# Patient Record
Sex: Male | Born: 1995 | Race: Black or African American | Hispanic: No | Marital: Single
Health system: Midwestern US, Community
[De-identification: ages and names within clinical notes are randomized; demographics above are authoritative.]

## PROBLEM LIST (undated history)

## (undated) DIAGNOSIS — E109 Type 1 diabetes mellitus without complications: Principal | ICD-10-CM

## (undated) DIAGNOSIS — G44219 Episodic tension-type headache, not intractable: Secondary | ICD-10-CM

## (undated) DIAGNOSIS — R519 Headache, unspecified: Principal | ICD-10-CM

## (undated) DIAGNOSIS — E782 Mixed hyperlipidemia: Secondary | ICD-10-CM

## (undated) DIAGNOSIS — K219 Gastro-esophageal reflux disease without esophagitis: Secondary | ICD-10-CM

## (undated) DIAGNOSIS — M6740 Ganglion, unspecified site: Secondary | ICD-10-CM

## (undated) HISTORY — PX: WRIST SURGERY: SHX841

---

## 2018-02-28 ENCOUNTER — Encounter (HOSPITAL_COMMUNITY): Payer: Self-pay | Admitting: Emergency Medicine

## 2018-02-28 ENCOUNTER — Other Ambulatory Visit: Payer: Self-pay

## 2018-02-28 ENCOUNTER — Ambulatory Visit (INDEPENDENT_AMBULATORY_CARE_PROVIDER_SITE_OTHER): Payer: Managed Care, Other (non HMO)

## 2018-02-28 ENCOUNTER — Ambulatory Visit (HOSPITAL_COMMUNITY)
Admission: EM | Admit: 2018-02-28 | Discharge: 2018-02-28 | Disposition: A | Discharge status: Home / Self Care | Payer: Managed Care, Other (non HMO) | Attending: Emergency Medicine | Admitting: Emergency Medicine

## 2018-02-28 DIAGNOSIS — M25511 Pain in right shoulder: Secondary | ICD-10-CM

## 2018-02-28 DIAGNOSIS — S0081XA Abrasion of other part of head, initial encounter: Secondary | ICD-10-CM | POA: Diagnosis not present

## 2018-02-28 HISTORY — DX: Ganglion, unspecified site: M67.40

## 2018-02-28 MED ORDER — IBUPROFEN 800 MG PO TABS
ORAL_TABLET | ORAL | Status: AC
  Filled 2018-02-28: qty 1

## 2018-02-28 MED ORDER — IBUPROFEN 800 MG PO TABS
800.0000 mg | ORAL_TABLET | Freq: Once | ORAL | Status: AC
  Administered 2018-02-28: 800 mg via ORAL

## 2018-02-28 NOTE — Discharge Instructions (Signed)
Ibuprofen regularly for the next 5-7 days. Take with food. Ice to areas affected including shoulder and chest. You will likely have more soreness tomorrow. Warm showers, hot compresses to sore muscles. Activity as tolerated, shoulder range of motion exercises as able. If symptoms worsen or do not improve in the next week to return to be seen or to follow up with your PCP.
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
## 2018-03-27 ENCOUNTER — Ambulatory Visit (HOSPITAL_COMMUNITY)
Admission: EM | Admit: 2018-03-27 | Discharge: 2018-03-27 | Disposition: A | Discharge status: Home / Self Care | Payer: Managed Care, Other (non HMO) | Attending: Physician Assistant | Admitting: Physician Assistant

## 2018-03-27 ENCOUNTER — Encounter (HOSPITAL_COMMUNITY): Payer: Self-pay | Admitting: Family Medicine

## 2018-03-27 DIAGNOSIS — H6123 Impacted cerumen, bilateral: Secondary | ICD-10-CM

## 2018-03-27 DIAGNOSIS — H9192 Unspecified hearing loss, left ear: Secondary | ICD-10-CM | POA: Diagnosis not present

## 2018-03-27 NOTE — ED Triage Notes (Signed)
Pt here for ear fullness and wax build up in the left ear

## 2018-03-27 NOTE — Discharge Instructions (Signed)
Ear wax removed today. You can use hydrogen peroxide/debrox intermittently to prevent build up. Recheck as needed.

## 2018-03-27 NOTE — ED Provider Notes (Signed)
[facility]    CSN: [id number] Arrival date & time: 03/27/18  1422     History   Chief Complaint Chief Complaint  Patient presents with  . Ear Fullness    HPI Maryellen PileJustin Sindelar is a 22 y.o. male.   22 year old male comes in for 1 day history of left ear fullness/decrease in hearing. Denies pain, drainage. Denies injury/trauma. Has had some rhinorrhea, otherwise denies other URI symptoms such as sore throat, cough, congestion. Denies fever, chills, night sweats. Tried debrox without improvement. Denies swimming/recent travels.      Past Medical History:  Diagnosis Date  . Ganglion cyst     There are no active problems to display for this patient.   Past Surgical History:  Procedure Laterality Date  . WRIST SURGERY         Home Medications    Prior to Admission medications   Not on File    Family History Family History  Problem Relation Age of Onset  . Healthy Mother     Social History Social History   Tobacco Use  . Smoking status: Never Smoker  Substance Use Topics  . Alcohol use: No    Frequency: Never  . Drug use: No     Allergies   Patient has no known allergies.   Review of Systems Review of Systems  Reason unable to perform ROS: See HPI as above.     Physical Exam Triage Vital Signs ED Triage Vitals [03/27/18 1541]  Enc Vitals Group     BP 110/62     Pulse Rate 70     Resp 18     Temp 98.2 F (36.8 C)     Temp src      SpO2 100 %     Weight      Height      Head Circumference      Peak Flow      Pain Score 0     Pain Loc      Pain Edu?      Excl. in GC?    No data found.  Updated Vital Signs BP 110/62   Pulse 70   Temp 98.2 F (36.8 C)   Resp 18   SpO2 100%   Physical Exam  Constitutional: He is oriented to person, place, and time. He appears well-developed and well-nourished. No distress.  HENT:  Head: Normocephalic and atraumatic.  Right Ear: External ear and ear canal normal.  Left Ear: External  ear and ear canal normal.  No tenderness to palpation of bilateral tragus. Bilateral cerumen impaction, TM not visible.   TM visible post irrigation.  No erythema, bulging.  TM pearly gray.  Eyes: Pupils are equal, round, and reactive to light. Conjunctivae are normal.  Neurological: He is alert and oriented to person, place, and time.     UC Treatments / Results  Labs (all labs ordered are listed, but only abnormal results are displayed) Labs Reviewed - No data to display  EKG None Radiology No results found.  Procedures Procedures (including critical care time)  Medications Ordered in UC Medications - No data to display   Initial Impression / Assessment and Plan / UC Course  I have reviewed the triage vital signs and the nursing notes.  Pertinent labs & imaging results that were available during my care of the patient were reviewed by me and considered in my medical decision making (see chart for details).    Patient with resolved  symptoms after cerumen removal.  Discussed ways to prevent future cerumen impaction. Recheck as needed.  Patient expresses understanding and agrees to plan.  Final Clinical Impressions(s) / UC Diagnoses   Final diagnoses:  Bilateral impacted cerumen    ED Discharge Orders    None         Lurline Idol 03/27/18 2007
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
## 2021-04-20 ENCOUNTER — Inpatient Hospital Stay
Admit: 2021-04-20 | Discharge: 2021-04-21 | Disposition: A | Discharge status: Home / Self Care | Payer: PRIVATE HEALTH INSURANCE | Attending: Emergency Medicine

## 2021-04-20 DIAGNOSIS — E1069 Type 1 diabetes mellitus with other specified complication: Principal | ICD-10-CM

## 2021-04-20 NOTE — ED Provider Notes (Signed)
[facility] ED  EMERGENCY DEPARTMENT ENCOUNTER      Pt Name: Brian Moses  MRN: [id number]  Birthdate [birth year]  Date of evaluation: 04/20/21      CHIEF COMPLAINT       Chief Complaint   Patient presents with   ??? Medication Refill     HISTORY OF PRESENT ILLNESS   HPI 25 y.o. male presents with c/o presents for refill of his diabetic medications.  The patient states that he is new to the area, he moved here from [place].  He has not been established with a PCP yet.  He ran out of his insulin today.  He needs refills.  He otherwise denies any complaints.Marland Kitchen     REVIEW OF SYSTEMS       Review of Systems   Constitutional: Negative for fever.   Respiratory: Negative for cough.    Gastrointestinal: Negative for abdominal pain, nausea and vomiting.   Skin: Negative for rash.       PAST MEDICAL HISTORY     Past Medical History:   Diagnosis Date   ??? Diabetes mellitus type 1 (HCC)        SURGICAL HISTORY     History reviewed. No pertinent surgical history.    CURRENT MEDICATIONS       Discharge Medication List as of 04/20/2021  9:52 PM      CONTINUE these medications which have NOT CHANGED    Details   insulin lispro (HUMALOG) 100 UNIT/ML injection vial Inject 6 Units into the skin 3 times daily (before meals)Historical Med             ALLERGIES     has No Known Allergies.    FAMILY HISTORY     has no family status information on file.        SOCIAL HISTORY      reports that he has never smoked. He has never used smokeless tobacco. He reports that he does not drink alcohol and does not use drugs.    PHYSICAL EXAM     INITIAL VITALS: BP 130/66    Pulse 64    Temp 98.6 ??F (37 ??C) (Oral)    Resp 16    Ht 6' (1.829 m)    Wt 155 lb (70.3 kg)    SpO2 100%    BMI 21.02 kg/m??   General: NAD  ENT: MMM  Cardiovascular: RRR  Pulmonary: CTA  Neuro: ANO x3 fluent speech ambulatory with a normal gait.    MEDICAL DECISION MAKING:     MDM  25 y.o. male presenting for refill of his diabetic medications.  He is a type I diabetic.  He has had  them up to today.  He is not showing no sign of DKA or dehydration from uncontrolled hyperglycemia.  I do not think we need to test any laboratory studies.  We will refill his medicine.  Provide him with outpatient follow-up. D/w pt treatment plan, warning precautions for prompt ED return and importance of close OP FU, he verbalizes understanding and agrees with the treatment plan.     DIAGNOSTIC RESULTS     EMERGENCY DEPARTMENT COURSE:   Vitals:    Vitals:    04/20/21 1907   BP: 130/66   Pulse: 64   Resp: 16   Temp: 98.6 ??F (37 ??C)   TempSrc: Oral   SpO2: 100%   Weight: 155 lb (70.3 kg)   Height: 6' (1.829 m)  The patient was given the following medications while in the emergency department:  Orders Placed This Encounter   Medications   ??? DISCONTD: insulin detemir (LEVEMIR) 100 UNIT/ML injection vial     Sig: Inject 13 Units into the skin nightly     Dispense:  2 each     Refill:  3   ??? DISCONTD: insulin aspart (NOVOLOG FLEXPEN) 100 UNIT/ML injection pen     Sig: Inject 4 Units into the skin 3 times daily (before meals)     Dispense:  5 pen     Refill:  0   ??? DISCONTD: Insulin Pen Needle 32G X 4 MM MISC     Sig: 1 each by Does not apply route daily     Dispense:  100 each     Refill:  0   ??? DISCONTD: Continuous Blood Gluc Sensor (FREESTYLE LIBRE 14 DAY SENSOR) MISC     Sig: Use as directed for glucose monitoring     Dispense:  2 each     Refill:  0   ??? Continuous Blood Gluc Sensor (FREESTYLE LIBRE 14 DAY SENSOR) MISC     Sig: Use as directed for glucose monitoring     Dispense:  2 each     Refill:  0   ??? insulin aspart (NOVOLOG FLEXPEN) 100 UNIT/ML injection pen     Sig: Inject 4 Units into the skin 3 times daily (before meals)     Dispense:  5 pen     Refill:  0   ??? insulin detemir (LEVEMIR) 100 UNIT/ML injection vial     Sig: Inject 13 Units into the skin nightly     Dispense:  2 each     Refill:  3   ??? Insulin Pen Needle 32G X 4 MM MISC     Sig: 1 each by Does not apply route daily     Dispense:  100 each      Refill:  0     -------------------------  CRITICAL CARE:   CONSULTS: None  PROCEDURES: Procedures     FINAL IMPRESSION      1. Type 1 diabetes mellitus with other specified complication [facility])          DISPOSITION/PLAN   DISPOSITION Decision To Discharge 04/20/2021 08:53:06 PM      PATIENT REFERRED TO:  [facility]  [address]  [place] [address]  [phone]        [facility]  [address]  [place] [address]  [phone]    As needed      DISCHARGE MEDICATIONS:  Discharge Medication List as of 04/20/2021  9:52 PM            Manley Mason, MD  Attending Emergency Physician                     Manley Mason, MD  04/20/21 [phone]

## 2021-04-21 MED ORDER — LEVEMIR 100 UNIT/ML SC SOLN
100 UNIT/ML | Freq: Every evening | SUBCUTANEOUS | 3 refills | Status: DC
Start: 2021-04-21 — End: 2021-04-20

## 2021-04-21 MED ORDER — NOVOLOG FLEXPEN 100 UNIT/ML SC SOPN
100 UNIT/ML | PEN_INJECTOR | Freq: Three times a day (TID) | SUBCUTANEOUS | 0 refills | Status: DC
Start: 2021-04-21 — End: 2021-04-20

## 2021-04-21 MED ORDER — FREESTYLE LIBRE 14 DAY SENSOR MISC
0 refills | Status: DC
Start: 2021-04-21 — End: 2021-05-29

## 2021-04-21 MED ORDER — NOVOLOG FLEXPEN 100 UNIT/ML SC SOPN
100 UNIT/ML | PEN_INJECTOR | Freq: Three times a day (TID) | SUBCUTANEOUS | 0 refills | Status: DC
Start: 2021-04-21 — End: 2021-05-29

## 2021-04-21 MED ORDER — LEVEMIR 100 UNIT/ML SC SOLN
100 UNIT/ML | Freq: Every evening | SUBCUTANEOUS | 3 refills | Status: DC
Start: 2021-04-21 — End: 2021-05-29

## 2021-04-21 MED ORDER — INSULIN PEN NEEDLE 32G X 4 MM MISC
Freq: Every day | 0 refills | Status: DC
Start: 2021-04-21 — End: 2021-10-14

## 2021-04-21 MED ORDER — INSULIN PEN NEEDLE 32G X 4 MM MISC
Freq: Every day | 0 refills | Status: DC
Start: 2021-04-21 — End: 2021-04-20

## 2021-04-21 MED ORDER — FREESTYLE LIBRE 14 DAY SENSOR MISC
0 refills | Status: DC
Start: 2021-04-21 — End: 2021-04-20

## 2021-05-29 ENCOUNTER — Ambulatory Visit
Admit: 2021-05-29 | Discharge: 2021-05-29 | Payer: PRIVATE HEALTH INSURANCE | Attending: Physician Assistant | Primary: Physician Assistant

## 2021-05-29 DIAGNOSIS — Z7689 Persons encountering health services in other specified circumstances: Secondary | ICD-10-CM

## 2021-05-29 LAB — POCT MICROALBUMIN
Creatinine Ur POCT: 300
Microalb, Ur: 30
Microalbumin Creatinine Ratio: <30

## 2021-05-29 LAB — POCT GLYCOSYLATED HEMOGLOBIN (HGB A1C): Hemoglobin A1C: 7 %

## 2021-05-29 MED ORDER — FREESTYLE LIBRE 14 DAY SENSOR MISC
2 refills | Status: DC
Start: 2021-05-29 — End: 2021-08-22

## 2021-05-29 MED ORDER — NOVOLOG FLEXPEN 100 UNIT/ML SC SOPN
100 UNIT/ML | PEN_INJECTOR | Freq: Three times a day (TID) | SUBCUTANEOUS | 0 refills | Status: DC
Start: 2021-05-29 — End: 2021-09-17

## 2021-05-29 NOTE — Progress Notes (Signed)
[facility]  [facility]  [address]  [address]  [place] [address]  Dept: [phone]  Dept Fax: [phone]    Brian Moses is a 25 y.o. male who presents today for his medical conditions/complaints as noted below.    Chief Complaint   Patient presents with   ??? Establish Care     Diabetes Type 1 needs referral to Endocrinology       HPI:     Patient presents to the office to establish care.  He is new to the area.  He has a past medical history including type 1 diabetes which was diagnosed a few months ago.      Today, primary concern is establishing care and getting refills for his diabetes.  Reports few months ago in [place] he was diagnosed with type 1 diabetes.  He went to the hospital on blood sugar was over 500.  He had made significant changes to his lifestyle habits and is currently on mealtime insulin 4 units.  He states that his morning sugars are typically between 90 and 100 and he is not using any insulin bolus at night.  Patient is tracking sugars with freestyle libre.  He denies any complications with his diabetes.  Denies any lightheadedness, dizziness, shortness of breath, chest pain, leg edema, syncope.  Denies any hypoglycemic episodes.  Denies any neurologic, cardiovascular issues.    No new or acute concerns.  BP stable.  Weight stable.      Hemoglobin A1C (%)   Date Value   05/29/2021 7.0             ( goal A1C is < 7)   No results found for: LABMICR  No results found for: LDLCHOLESTEROL, LDLCALC    (goal LDL is <100)   No results found for: AST, ALT, BUN, CR  BP Readings from Last 3 Encounters:   05/29/21 124/82   04/20/21 130/66          (goal 120/80)    Past Medical History:   Diagnosis Date   ??? Diabetes mellitus type 1 (HCC)       History reviewed. No pertinent surgical history.    History reviewed. No pertinent family history.    Social History     Tobacco Use   ??? Smoking status: Never Smoker   ??? Smokeless tobacco: Never Used   Substance Use  Topics   ??? Alcohol use: Never      Current Outpatient Medications   Medication Sig Dispense Refill   ??? Continuous Blood Gluc Sensor (FREESTYLE LIBRE 14 DAY SENSOR) MISC Use as directed for glucose monitoring 2 each 2   ??? insulin aspart (NOVOLOG FLEXPEN) 100 UNIT/ML injection pen Inject 4 Units into the skin 3 times daily (before meals) 5 pen 0   ??? Insulin Pen Needle 32G X 4 MM MISC 1 each by Does not apply route daily 100 each 0     No current facility-administered medications for this visit.     No Known Allergies    Health Maintenance   Topic Date Due   ??? Varicella vaccine (1 of 2 - 2-dose childhood series) Never done   ??? COVID-19 Vaccine (1) Never done   ??? Pneumococcal 0-64 years Vaccine (1 - PCV) Never done   ??? HPV vaccine (1 - Male 2-dose series) Never done   ??? HIV screen  Never done   ??? Hepatitis C screen  Never done   ??? DTaP/Tdap/Td  vaccine (1 - Tdap) Never done   ??? Flu vaccine (Season Ended) 08/28/2021   ??? Depression Screen  05/29/2022   ??? Hepatitis A vaccine  Aged Out   ??? Hepatitis B vaccine  Aged Out   ??? Hib vaccine  Aged Out   ??? Meningococcal (ACWY) vaccine  Aged Out       Subjective:      Review of Systems   Constitutional: Negative for chills, fatigue and fever.   HENT: Negative for congestion, rhinorrhea and sinus pain.    Eyes: Negative for pain.   Respiratory: Negative for cough and shortness of breath.    Cardiovascular: Negative for chest pain and leg swelling.   Gastrointestinal: Negative for abdominal pain, constipation, diarrhea, nausea and vomiting.   Genitourinary: Negative for difficulty urinating, enuresis and testicular pain.   Musculoskeletal: Negative for arthralgias, back pain and myalgias.   Skin: Negative for rash.   Neurological: Negative for dizziness and headaches.   Psychiatric/Behavioral: Negative for confusion and sleep disturbance. The patient is not nervous/anxious.    All other systems reviewed and are negative.      Objective:     Physical Exam  Vitals and nursing note  reviewed.   Constitutional:       General: He is not in acute distress.     Appearance: Normal appearance. He is normal weight.   HENT:      Head: Normocephalic.      Mouth/Throat:      Mouth: Mucous membranes are moist.   Eyes:      Extraocular Movements: Extraocular movements intact.      Conjunctiva/sclera: Conjunctivae normal.      Pupils: Pupils are equal, round, and reactive to light.   Cardiovascular:      Rate and Rhythm: Normal rate and regular rhythm.      Pulses: Normal pulses.      Heart sounds: Normal heart sounds.   Pulmonary:      Effort: Pulmonary effort is normal.      Breath sounds: Normal breath sounds.   Abdominal:      General: Abdomen is flat. Bowel sounds are normal.      Palpations: Abdomen is soft.      Tenderness: There is no abdominal tenderness.   Musculoskeletal:      Cervical back: Normal range of motion.      Right lower leg: No edema.      Left lower leg: No edema.   Lymphadenopathy:      Cervical: No cervical adenopathy.   Skin:     General: Skin is warm.      Capillary Refill: Capillary refill takes less than 2 seconds.   Neurological:      General: No focal deficit present.      Mental Status: He is alert and oriented to person, place, and time.   Psychiatric:         Mood and Affect: Mood normal.         Behavior: Behavior normal.       BP 124/82 (Site: Left Upper Arm, Position: Sitting, Cuff Size: Medium Adult)    Pulse 68    Resp 16    Ht 6\' 1"  (1.854 m)    Wt 161 lb 3.2 oz (73.1 kg)    SpO2 97%    BMI 21.27 kg/m??     Assessment:       ICD-10-CM    1. Encounter to establish care  Z76.89    2.  Type 1 diabetes mellitus without complication (HCC)  E10.9 POCT glycosylated hemoglobin (Hb A1C)     POCT microalbumin     HM DIABETES FOOT EXAM     Continuous Blood Gluc Sensor (FREESTYLE LIBRE 14 DAY SENSOR) MISC     insulin aspart (NOVOLOG FLEXPEN) 100 UNIT/ML injection pen     Willette Pa, MD, Endocrinology, [facility]            Plan:       1.  Initial visit to establish care.  2.   Patient with history of type 1 diabetes.  A1c is stable at 7.0.  He is to continue current insulin regimen and tracking with freestyle libre..  In addition, patient is given referral to endocrinology for further evaluation and assessment as I am leery on type I diagnosis.  Diabetic foot exam within normal limits.  Microalbumin check within normal limits.    Return in about 3 months (around 08/29/2021) for medication recheck, diabetes, blood pressure.    Orders Placed This Encounter   Procedures   ??? Willette Pa, MD, Endocrinology, [facility]     Referral Priority:   Routine     Referral Type:   Eval and Treat     Referral Reason:   Specialty Services Required     Referred to Provider:   Willette Pa, MD     Requested Specialty:   Endocrinology     Number of Visits Requested:   1   ??? POCT glycosylated hemoglobin (Hb A1C)   ??? POCT microalbumin   ??? HM DIABETES FOOT EXAM         Patient given educational materials - see patient instructions.  Discussed use, benefit, and side effects of prescribed medications.  All patient questions answered. Pt voiced understanding. Reviewed health maintenance.  Instructed to continue current medications, diet and exercise.  Patient agreed with treatment plan. Follow up as directed.        Electronically signed by Madelyn Brunner, PA-C on 05/29/2021 at 2:26 PM

## 2021-06-11 ENCOUNTER — Encounter: Attending: Family Medicine | Primary: Physician Assistant

## 2021-08-13 NOTE — Telephone Encounter (Signed)
Mailbox full, unable to lvm to ask Pt if he would like to see Kenard Gower in the [place] office on 08/29/21 for his appt or if he would like to rs appt for a different day in [place] office. 08/13/21.

## 2021-08-22 ENCOUNTER — Encounter

## 2021-08-22 NOTE — Telephone Encounter (Signed)
Last Visit Date: 05/29/2021   Next Visit Date: 08/29/2021

## 2021-08-25 MED ORDER — FREESTYLE LIBRE 14 DAY SENSOR MISC
2 refills | Status: DC
Start: 2021-08-25 — End: 2021-09-17

## 2021-08-29 ENCOUNTER — Ambulatory Visit
Admit: 2021-08-29 | Discharge: 2021-08-29 | Payer: PRIVATE HEALTH INSURANCE | Attending: Physician Assistant | Primary: Physician Assistant

## 2021-08-29 ENCOUNTER — Encounter: Attending: Physician Assistant | Primary: Physician Assistant

## 2021-08-29 DIAGNOSIS — E109 Type 1 diabetes mellitus without complications: Secondary | ICD-10-CM

## 2021-08-29 NOTE — Progress Notes (Signed)
[facility]  [facility] [place] [facility]  [address]  [place] [address]  Dept: [phone]  Dept Fax: [phone]    Brian Moses is a 25 y.o. male who presents today for his medical conditions/complaints as noted below.    Chief Complaint   Patient presents with    Diabetes     Patient is here to follow up with his diabetes, he denies chest pain and sob. Patient states that he has no new concerns. Had lab work on 08/06/21       HPI:     Patient presents to the office for diabetes and after endocrinology.  He was able to get into Dr. Jayme Cloud at [facility].  Blood work was reviewed and demonstrates increased A1c to 9.1.  Endocrinology adjusted his insulin regimen to include 6 units with meals and 10 units nightly.  He states the endocrinologist was most concerned for type I diabetes not type II.  I reviewed his lab results.  There is elevated cholesterol.  He states he is following with Dr. Jayme Cloud next week.    Patient denies any new or acute concerns today.  Denies any lightheadedness, dizziness, shortness of breath, chest pain, leg edema, syncope.  BP stable.  Weight slightly decreased from last office visit.    He reports that next month he will be moving to [place].  I strongly advise finding a PCP and endocrinologist now as it can take several weeks.        Hemoglobin A1C (%)   Date Value   05/29/2021 7.0             ( goal A1C is < 7)   No results found for: LABMICR  No results found for: LDLCHOLESTEROL, LDLCALC    (goal LDL is <100)   No results found for: AST, ALT, BUN, CR  BP Readings from Last 3 Encounters:   08/29/21 114/72   05/29/21 124/82   04/20/21 130/66          (goal 120/80)    Past Medical History:   Diagnosis Date    Diabetes mellitus type 1 (HCC)       History reviewed. No pertinent surgical history.    History reviewed. No pertinent family history.    Social History     Tobacco Use    Smoking status: Never    Smokeless tobacco: Never   Substance Use Topics     Alcohol use: Never      Current Outpatient Medications   Medication Sig Dispense Refill    Continuous Blood Gluc Sensor (FREESTYLE LIBRE 14 DAY SENSOR) MISC Use as directed for glucose monitoring 2 each 2    insulin aspart (NOVOLOG FLEXPEN) 100 UNIT/ML injection pen Inject 4 Units into the skin 3 times daily (before meals) (Patient taking differently: Inject 6 Units into the skin 3 times daily (before meals)) 5 pen 0    Insulin Pen Needle 32G X 4 MM MISC 1 each by Does not apply route daily 100 each 0    BASAGLAR KWIKPEN 100 UNIT/ML injection pen INJECT 10 UNITS SUBCUTANEOUS 1X PER DAY 90 DAYS      BD PEN NEEDLE MICRO U/F 32G X 6 MM MISC USE AS DIRECTED 4 TIMES A DAY FOR 90 DAYS       No current facility-administered medications for this visit.     No Known Allergies    Health Maintenance   Topic Date Due    COVID-19 Vaccine (1) Never done  Varicella vaccine (1 of 2 - 2-dose childhood series) Never done    Pneumococcal 0-64 years Vaccine (1 - PCV) Never done    HPV vaccine (1 - Male 2-dose series) Never done    HIV screen  Never done    Diabetic retinal exam  Never done    Hepatitis C screen  Never done    Hepatitis B vaccine (1 of 3 - Risk 3-dose series) Never done    DTaP/Tdap/Td vaccine (1 - Tdap) Never done    Flu vaccine (1) Never done    Diabetic foot exam  05/29/2022    A1C test (Diabetic or Prediabetic)  08/06/2022    Diabetic microalbuminuria test  08/06/2022    Lipids  08/06/2022    Depression Screen  08/29/2022    Hepatitis A vaccine  Aged Out    Hib vaccine  Aged Out    Meningococcal (ACWY) vaccine  Aged Out       Subjective:      Review of Systems   Constitutional:  Negative for chills, fatigue and fever.   HENT:  Negative for congestion, rhinorrhea and sinus pain.    Eyes:  Negative for pain.   Respiratory:  Negative for cough and shortness of breath.    Cardiovascular:  Negative for chest pain and leg swelling.   Gastrointestinal:  Negative for abdominal pain, constipation, diarrhea, nausea and  vomiting.   Genitourinary:  Negative for difficulty urinating, enuresis and testicular pain.   Musculoskeletal:  Negative for arthralgias, back pain and myalgias.   Skin:  Negative for rash.   Neurological:  Negative for dizziness and headaches.   Psychiatric/Behavioral:  Negative for confusion and sleep disturbance. The patient is not nervous/anxious.    All other systems reviewed and are negative.    Objective:     Physical Exam  Vitals and nursing note reviewed.   Constitutional:       General: He is not in acute distress.     Appearance: Normal appearance. He is normal weight.   HENT:      Head: Normocephalic.      Mouth/Throat:      Mouth: Mucous membranes are moist.   Eyes:      Extraocular Movements: Extraocular movements intact.      Conjunctiva/sclera: Conjunctivae normal.      Pupils: Pupils are equal, round, and reactive to light.   Cardiovascular:      Rate and Rhythm: Normal rate and regular rhythm.      Pulses: Normal pulses.      Heart sounds: Normal heart sounds. No murmur heard.  Pulmonary:      Effort: Pulmonary effort is normal. No respiratory distress.      Breath sounds: Normal breath sounds.   Musculoskeletal:      Cervical back: Normal range of motion.      Right lower leg: No edema.      Left lower leg: No edema.   Lymphadenopathy:      Cervical: No cervical adenopathy.   Skin:     General: Skin is warm.      Capillary Refill: Capillary refill takes less than 2 seconds.   Neurological:      General: No focal deficit present.      Mental Status: He is alert and oriented to person, place, and time.   Psychiatric:         Mood and Affect: Mood normal.         Behavior: Behavior normal.  BP 114/72    Pulse 72    Ht 6\' 1"  (1.854 m)    Wt 154 lb (69.9 kg)    SpO2 96%    BMI 20.32 kg/m??     Assessment:       ICD-10-CM    1. Type 1 diabetes mellitus without complication (HCC)  E10.9       2. Mixed hyperlipidemia  E78.2                Plan:      1.  Patient to continue current insulin regimen as  recommended by endocrinology.  I discussed the importance of tight glycemic control for the outcome of his disease.  I advised on counting carbohydrates.  Discussed the importance of CGM.  2.  Patient with evidence of mixed hyperlipidemia.  We discussed its role with diabetes.  I advised on possibly starting statin.  He would like to follow with endocrinology first.    Return if symptoms worsen or fail to improve.    No orders of the defined types were placed in this encounter.        Patient given educational materials - see patient instructions.  Discussed use, benefit, and side effects of prescribed medications.  All patient questions answered. Pt voiced understanding. Reviewed health maintenance.  Instructed to continue current medications, diet and exercise.  Patient agreed with treatment plan. Follow up as directed.        Electronically signed by , PA-C on 09/01/2021 at 8:58 AM

## 2021-09-05 LAB — HEMOGLOBIN A1C
AVERAGE GLUCOSE: 167
Hemoglobin A1C: 8.2 %

## 2021-09-17 ENCOUNTER — Encounter

## 2021-09-17 MED ORDER — FREESTYLE LIBRE 14 DAY SENSOR MISC
2 refills | Status: DC
Start: 2021-09-17 — End: 2021-11-03

## 2021-09-17 MED ORDER — NOVOLOG FLEXPEN 100 UNIT/ML SC SOPN
100 UNIT/ML | Freq: Three times a day (TID) | SUBCUTANEOUS | 0 refills | Status: DC
Start: 2021-09-17 — End: 2022-02-23

## 2021-09-17 NOTE — Telephone Encounter (Signed)
Last Visit Date: 08/29/2021   Next Visit Date: Visit date not found

## 2021-10-14 MED ORDER — INSULIN PEN NEEDLE 32G X 4 MM MISC
Freq: Every day | 2 refills | Status: AC
Start: 2021-10-14 — End: 2022-05-04

## 2021-10-14 NOTE — Telephone Encounter (Signed)
From: Maryellen Pile  To: Raymond Gurney  Sent: 10/10/2021 10:10 AM EDT  Subject: Running out of 84mm needles for [employer],    I???m starting to get low on the BD Nano 52mm x 320 needles for my Novolog pen and I can???t seem to request a refill. Can you please refill this for me? Thank you.    Maryellen Pile

## 2021-11-03 ENCOUNTER — Encounter

## 2021-11-03 MED ORDER — FREESTYLE LIBRE 14 DAY SENSOR MISC
2 refills | Status: DC
Start: 2021-11-03 — End: 2021-12-15

## 2021-11-03 NOTE — Telephone Encounter (Signed)
Last Visit Date: 08/29/2021   Next Visit Date: Visit date not found

## 2021-12-11 ENCOUNTER — Encounter: Payer: PRIVATE HEALTH INSURANCE | Attending: Physician Assistant | Primary: Physician Assistant

## 2021-12-12 ENCOUNTER — Encounter: Payer: PRIVATE HEALTH INSURANCE | Attending: Physician Assistant | Primary: Physician Assistant

## 2021-12-12 ENCOUNTER — Ambulatory Visit
Admit: 2021-12-12 | Discharge: 2021-12-12 | Payer: PRIVATE HEALTH INSURANCE | Attending: Physician Assistant | Primary: Physician Assistant

## 2021-12-12 DIAGNOSIS — E109 Type 1 diabetes mellitus without complications: Secondary | ICD-10-CM

## 2021-12-12 MED ORDER — ATORVASTATIN CALCIUM 10 MG PO TABS
10 MG | ORAL_TABLET | Freq: Every day | ORAL | 2 refills | Status: DC
Start: 2021-12-12 — End: 2022-01-05

## 2021-12-12 MED ORDER — BD PEN NEEDLE MICRO U/F 32G X 6 MM MISC
4 refills | Status: DC
Start: 2021-12-12 — End: 2022-05-04

## 2021-12-12 NOTE — Progress Notes (Signed)
[facility]  [facility] [place] [facility]  [address]  [place] [address]  Dept: [phone]  Dept Fax: [phone]    Brian Moses is a 25 y.o. male who presents today for his medical conditions/complaints as noted below.    Chief Complaint   Patient presents with    Diabetes     Has appointment on 01/05/2021 with [facility]       HPI:     Patient presents to the office for routine follow-up.  He has past medical history including type 1 diabetes.  Patient is currently following with [facility] for management of type 1 diabetes.  His last A1c was 6.6.  He currently is using 8 units Basaglar daily and 2 to 4 units NovoLog as needed with meals.  He is closely monitoring sugars with freestyle libre.  Patient is considering leaving endocrinology.  He has not been happy with care.  He would like for our office to manage his type 1 diabetes.  We discussed that typically type 1 diabetes is best managed with endocrinology, however if needed we can manage for him.    We reviewed most recent blood work.  There is evidence of dyslipidemia.  We discussed thoroughly about cholesterol and management.  He does admit to family history of high cholesterol.    Denies any new or acute concerns.  BP stable.  Weight stable.      Hemoglobin A1C (%)   Date Value   09/05/2021 8.2   05/29/2021 7.0             ( goal A1C is < 7)   No results found for: LABMICR  No results found for: LDLCHOLESTEROL, LDLCALC    (goal LDL is <100)   No results found for: AST, ALT, BUN, CR  BP Readings from Last 3 Encounters:   12/12/21 124/84   08/29/21 114/72   05/29/21 124/82          (goal 120/80)    Past Medical History:   Diagnosis Date    Diabetes mellitus type 1 (HCC)       History reviewed. No pertinent surgical history.    History reviewed. No pertinent family history.    Social History     Tobacco Use    Smoking status: Never    Smokeless tobacco: Never   Substance Use Topics    Alcohol use: Never      Current Outpatient Medications    Medication Sig Dispense Refill    BD PEN NEEDLE MICRO U/F 32G X 6 MM MISC USE AS DIRECTED 4 TIMES A DAY FOR 90 DAYS 100 each 4    atorvastatin (LIPITOR) 10 MG tablet Take 1 tablet by mouth daily 30 tablet 2    Insulin Pen Needle 32G X 4 MM MISC as directed      Insulin Pen Needle 32G X 4 MM MISC 1 each by Does not apply route daily 100 each 2    insulin aspart (NOVOLOG FLEXPEN) 100 UNIT/ML injection pen Inject 4 Units into the skin 3 times daily (before meals) 5 Adjustable Dose Pre-filled Pen Syringe 0    BASAGLAR KWIKPEN 100 UNIT/ML injection pen INJECT 10 UNITS SUBCUTANEOUS 1X PER DAY 90 DAYS      Continuous Blood Gluc Sensor (FREESTYLE LIBRE 14 DAY SENSOR) MISC Use as directed for glucose monitoring 2 each 2     No current facility-administered medications for this visit.     No Known Allergies    Health Maintenance  Topic Date Due    COVID-19 Vaccine (1) Never done    Varicella vaccine (1 of 2 - 2-dose childhood series) Never done    Pneumococcal 0-64 years Vaccine (1 - PCV) Never done    HPV vaccine (1 - Male 2-dose series) Never done    HIV screen  Never done    Diabetic retinal exam  Never done    Hepatitis C screen  Never done    Hepatitis B vaccine (1 of 3 - Risk 3-dose series) Never done    DTaP/Tdap/Td vaccine (1 - Tdap) Never done    Diabetic foot exam  05/29/2022    Diabetic microalbuminuria test  08/06/2022    Lipids  08/06/2022    A1C test (Diabetic or Prediabetic)  10/03/2022    Depression Screen  12/12/2022    Flu vaccine  Completed    Hepatitis A vaccine  Aged Out    Hib vaccine  Aged Out    Meningococcal (ACWY) vaccine  Aged Out       Subjective:      Review of Systems   Constitutional:  Negative for chills, fatigue and fever.   HENT:  Negative for congestion, rhinorrhea and sinus pain.    Eyes:  Negative for pain.   Respiratory:  Negative for cough and shortness of breath.    Cardiovascular:  Negative for chest pain and leg swelling.   Gastrointestinal:  Negative for abdominal pain,  constipation, diarrhea, nausea and vomiting.   Genitourinary:  Negative for difficulty urinating, enuresis and testicular pain.   Musculoskeletal:  Negative for arthralgias, back pain and myalgias.   Skin:  Negative for rash.   Neurological:  Negative for dizziness and headaches.   Psychiatric/Behavioral:  Negative for confusion and sleep disturbance. The patient is not nervous/anxious.      Objective:     Physical Exam  Vitals and nursing note reviewed.   Constitutional:       General: He is not in acute distress.     Appearance: Normal appearance. He is normal weight.   HENT:      Head: Normocephalic.      Mouth/Throat:      Mouth: Mucous membranes are moist.   Eyes:      Extraocular Movements: Extraocular movements intact.      Conjunctiva/sclera: Conjunctivae normal.      Pupils: Pupils are equal, round, and reactive to light.   Cardiovascular:      Rate and Rhythm: Normal rate and regular rhythm.      Pulses: Normal pulses.      Heart sounds: Normal heart sounds. No murmur heard.  Pulmonary:      Effort: Pulmonary effort is normal. No respiratory distress.      Breath sounds: Normal breath sounds.   Abdominal:      General: Abdomen is flat. Bowel sounds are normal.      Palpations: Abdomen is soft.      Tenderness: There is no abdominal tenderness.   Musculoskeletal:      Cervical back: Normal range of motion.      Right lower leg: No edema.      Left lower leg: No edema.   Lymphadenopathy:      Cervical: No cervical adenopathy.   Skin:     General: Skin is warm.      Capillary Refill: Capillary refill takes less than 2 seconds.   Neurological:      General: No focal deficit present.  Mental Status: He is alert and oriented to person, place, and time.   Psychiatric:         Mood and Affect: Mood normal.         Behavior: Behavior normal.     BP 124/84 (Site: Left Upper Arm, Position: Sitting, Cuff Size: Medium Adult)    Pulse 73    Resp 16    Ht 6\' 1"  (1.854 m)    Wt 159 lb 3.2 oz (72.2 kg)    SpO2 98%     BMI 21.00 kg/m??     Assessment:       ICD-10-CM    1. Type 1 diabetes mellitus without complication (HCC)  E10.9 BD PEN NEEDLE MICRO U/F 32G X 6 MM MISC      2. Mixed hyperlipidemia  E78.2 atorvastatin (LIPITOR) 10 MG tablet      3. Need for influenza vaccination  Z23 Influenza, FLUCELVAX, (age 25 mo+), IM, Preservative Free, 0.5 mL               Plan:      1.  Patient is following with endocrinology for type 1 diabetes.  Last A1c is 6.6.  Continue current insulin regimen.  Discussed the importance of diabetic management.  2.  We reviewed most recent lab results.  There is evidence of dyslipidemia.  Due to having diabetes I did recommend statin for prevention.  Plan for atorvastatin 10 mg once daily.  3.  Patient agreeable to influenza vaccine.    Return in about 8 months (around 08/12/2022) for physical.    Orders Placed This Encounter   Procedures    Influenza, FLUCELVAX, (age 25 mo+), IM, Preservative Free, 0.5 mL         Patient given educational materials - see patient instructions.  Discussed use, benefit, and side effects of prescribedmedications.  All patient questions answered. Pt voiced understanding. Reviewed health maintenance.  Instructed to continue current medications, diet and exercise.  Patient agreed with treatment plan. Follow up as directed.        Electronically signed by 08/14/2022, PA-C on 12/17/2021 at 7:00 AM

## 2021-12-15 ENCOUNTER — Encounter

## 2021-12-16 MED ORDER — FREESTYLE LIBRE 14 DAY SENSOR MISC
2 refills | Status: DC
Start: 2021-12-16 — End: 2022-01-11

## 2021-12-16 NOTE — Telephone Encounter (Signed)
Last Visit Date: 12/12/2021   Next Visit Date: 04/10/2022

## 2022-01-05 ENCOUNTER — Encounter

## 2022-01-05 MED ORDER — ATORVASTATIN CALCIUM 10 MG PO TABS
10 MG | ORAL_TABLET | ORAL | 2 refills | Status: DC
Start: 2022-01-05 — End: 2022-04-06

## 2022-01-05 NOTE — Telephone Encounter (Signed)
Last Visit Date: 12/12/2021   Next Visit Date: 04/10/2022

## 2022-01-11 ENCOUNTER — Encounter

## 2022-01-12 MED ORDER — FREESTYLE LIBRE 14 DAY SENSOR MISC
2 refills | Status: DC
Start: 2022-01-12 — End: 2022-02-01

## 2022-01-12 NOTE — Telephone Encounter (Signed)
Last Visit Date: 12/12/2021   Next Visit Date: 04/10/2022

## 2022-01-22 NOTE — Telephone Encounter (Signed)
From: Maryellen Pile  To: Raymond Gurney  Sent: 01/21/2022 8:35 PM EST  Subject: Alpha Lipoic Acid Tablets    Dr. Thersa Salt,    I was recommended to use Alpha Lipoic Acid tablets by someone who said has worked really well for their neuropathy. How do you feel about those tablets? Please let me know.    Thank you,  Maryellen Pile

## 2022-02-01 ENCOUNTER — Encounter

## 2022-02-02 MED ORDER — FREESTYLE LIBRE 14 DAY SENSOR MISC
2 refills | Status: DC
Start: 2022-02-02 — End: 2022-02-23

## 2022-02-02 NOTE — Telephone Encounter (Signed)
Last Visit Date: 12/12/2021   Next Visit Date: 04/10/2022

## 2022-02-16 NOTE — Telephone Encounter (Signed)
From: Maryellen Pile  To: Raymond Gurney  Sent: 02/16/2022 10:06 AM EST  Subject: Night Time Insulin    Good Morning Dr. Thersa Salt,    Over the past couple weeks, I???ve been decreasing my night time insulin because I???ve been experiencing drastic lows at night (45-60mg /dL). I???m down to 3 units of the Basaglar instead of the 6 units I used to do. If I keep experiencing lows at night, should I keep decreasing my insulin? What if even 1 unit becomes to much? Do I keep taking the night time insulin?

## 2022-02-23 ENCOUNTER — Encounter

## 2022-02-23 MED ORDER — FREESTYLE LIBRE 14 DAY SENSOR MISC
2 refills | Status: DC
Start: 2022-02-23 — End: 2022-03-17

## 2022-02-23 MED ORDER — NOVOLOG FLEXPEN 100 UNIT/ML SC SOPN
100 UNIT/ML | Freq: Three times a day (TID) | SUBCUTANEOUS | 0 refills | Status: AC
Start: 2022-02-23 — End: 2022-05-27

## 2022-02-23 NOTE — Telephone Encounter (Signed)
Last Visit Date: 12/12/2021   Next Visit Date: 04/03/2022

## 2022-02-24 NOTE — Telephone Encounter (Signed)
From: Maryellen Pile  To: Raymond Gurney  Sent: 02/23/2022 3:43 PM EST  Subject: Workout Supplements    Dr. Thersa Salt,    I???m considering taking Pre-Workout and BCAA supplements for my workouts. Do you know if those supplements affect my overall health from a diabetic standpoint and an overall health standpoint?

## 2022-03-17 ENCOUNTER — Encounter

## 2022-03-17 NOTE — Telephone Encounter (Signed)
Last Visit Date: 12/12/2021   Next Visit Date: 03/19/2022

## 2022-03-18 MED ORDER — FREESTYLE LIBRE 14 DAY SENSOR MISC
2 refills | Status: AC
Start: 2022-03-18 — End: 2022-04-05

## 2022-03-19 ENCOUNTER — Ambulatory Visit: Admit: 2022-03-19 | Discharge: 2022-03-19 | Payer: PRIVATE HEALTH INSURANCE | Primary: Physician Assistant

## 2022-03-19 ENCOUNTER — Ambulatory Visit
Admit: 2022-03-19 | Discharge: 2022-03-19 | Payer: PRIVATE HEALTH INSURANCE | Attending: Physician Assistant | Primary: Physician Assistant

## 2022-03-19 DIAGNOSIS — M542 Cervicalgia: Secondary | ICD-10-CM

## 2022-03-19 DIAGNOSIS — M545 Low back pain, unspecified: Secondary | ICD-10-CM

## 2022-03-19 MED ORDER — TIZANIDINE HCL 2 MG PO TABS
2 MG | ORAL_TABLET | Freq: Three times a day (TID) | ORAL | 0 refills | Status: AC | PRN
Start: 2022-03-19 — End: ?

## 2022-03-19 MED ORDER — NAPROXEN 250 MG PO TABS
250 MG | ORAL_TABLET | Freq: Two times a day (BID) | ORAL | 0 refills | Status: DC | PRN
Start: 2022-03-19 — End: 2023-06-30

## 2022-03-19 NOTE — Progress Notes (Signed)
[facility]  [facility] [place] [facility]  [address]  [place] [id number]  Dept: [phone]  Dept Fax: [phone]    Brian Moses is a 26 y.o. male who presents today for his medical conditions/complaints as noted below.    Chief Complaint   Patient presents with    Back Pain     Chronic lower back pain on and off, middle upper and neck pain x 2 weeks; has changed ergonomics at work, has used [employer] patches with minimal relief        HPI:     Patient presents to the office for discussion of spine pain.  He describes on and off back pain for the last couple years.  It has worsened over the last few weeks.  He describes pain in the low back and cervical spine region.  It is mainly muscular.  No radiation of pain into the extremities.  Denies any numbness or tingling.  No weakness.  No change in bowel or bladder function.  No saddle anesthesia.  He has tried over-the-counter topical patches and cream with minimal benefit.  He is trying to work on posture while working.  This is not changed pain significantly.  Pain is worse with physical activity and holding positions for long period of time.    No other concerns or complaints.  He continues to follow with endocrinology for management of type 1 diabetes.  Denies any lightheadedness, dizziness, shortness of breath, chest pain, leg edema, syncope.        Hemoglobin A1C (%)   Date Value   09/05/2021 8.2   05/29/2021 7.0             ( goal A1C is < 7)   No results found for: LABMICR  No results found for: LDLCHOLESTEROL, LDLCALC    (goal LDL is <100)   No results found for: AST, ALT, BUN, CR  BP Readings from Last 3 Encounters:   03/19/22 112/78   12/12/21 124/84   08/29/21 114/72          (goal 120/80)    Past Medical History:   Diagnosis Date    Diabetes mellitus type 1 (HCC)       History reviewed. No pertinent surgical history.    History reviewed. No pertinent family history.    Social History     Tobacco Use    Smoking status: Never     Smokeless tobacco: Never   Substance Use Topics    Alcohol use: Never      Current Outpatient Medications   Medication Sig Dispense Refill    tiZANidine (ZANAFLEX) 2 MG tablet Take 1 tablet by mouth 3 times daily as needed (muscle spasms) 30 tablet 0    naproxen (NAPROSYN) 250 MG tablet Take 1 tablet by mouth 2 times daily as needed for Pain 60 tablet 0    Continuous Blood Gluc Sensor (FREESTYLE LIBRE 14 DAY SENSOR) MISC Use as directed for glucose monitoring 2 each 2    insulin aspart (NOVOLOG FLEXPEN) 100 UNIT/ML injection pen Inject 4 Units into the skin 3 times daily (before meals) 5 Adjustable Dose Pre-filled Pen Syringe 0    atorvastatin (LIPITOR) 10 MG tablet TAKE 1 TABLET BY MOUTH EVERY DAY 30 tablet 2    BD PEN NEEDLE MICRO U/F 32G X 6 MM MISC USE AS DIRECTED 4 TIMES A DAY FOR 90 DAYS 100 each 4    Insulin Pen Needle 32G X 4 MM MISC as  directed      Insulin Pen Needle 32G X 4 MM MISC 1 each by Does not apply route daily 100 each 2    BASAGLAR KWIKPEN 100 UNIT/ML injection pen INJECT 10 UNITS SUBCUTANEOUS 1X PER DAY 90 DAYS       No current facility-administered medications for this visit.     No Known Allergies    Health Maintenance   Topic Date Due    COVID-19 Vaccine (1) Never done    Varicella vaccine (1 of 2 - 2-dose childhood series) Never done    Pneumococcal 0-64 years Vaccine (1 - PCV) Never done    HPV vaccine (1 - Male 2-dose series) Never done    HIV screen  Never done    Diabetic retinal exam  Never done    GFR test (Diabetes, CKD 3-4, OR last GFR 15-59)  Never done    Hepatitis C screen  Never done    Hepatitis B vaccine (1 of 3 - Risk 3-dose series) Never done    DTaP/Tdap/Td vaccine (1 - Tdap) Never done    Diabetic foot exam  05/29/2022    Diabetic Alb to Cr ratio (uACR) test  08/06/2022    A1C test (Diabetic or Prediabetic)  01/05/2023    Lipids  02/06/2023    Depression Screen  03/20/2023    Flu vaccine  Completed    Hepatitis A vaccine  Aged Out    Hib vaccine  Aged Out    Meningococcal  (ACWY) vaccine  Aged Out       Subjective:      Review of Systems   Constitutional:  Negative for chills, fatigue and fever.   HENT:  Negative for congestion, rhinorrhea and sinus pain.    Eyes:  Negative for pain.   Respiratory:  Negative for cough and shortness of breath.    Cardiovascular:  Negative for chest pain and leg swelling.   Gastrointestinal:  Negative for abdominal pain, constipation, diarrhea, nausea and vomiting.   Genitourinary:  Negative for difficulty urinating, enuresis and testicular pain.   Musculoskeletal:  Positive for back pain and neck pain. Negative for arthralgias and myalgias.   Skin:  Negative for rash.   Neurological:  Negative for dizziness and headaches.   Psychiatric/Behavioral:  Negative for confusion and sleep disturbance. The patient is not nervous/anxious.    All other systems reviewed and are negative.    Objective:     Physical Exam  Vitals and nursing note reviewed.   Constitutional:       General: He is not in acute distress.     Appearance: Normal appearance.   HENT:      Head: Normocephalic.      Mouth/Throat:      Mouth: Mucous membranes are moist.   Eyes:      Extraocular Movements: Extraocular movements intact.      Conjunctiva/sclera: Conjunctivae normal.      Pupils: Pupils are equal, round, and reactive to light.   Cardiovascular:      Rate and Rhythm: Normal rate and regular rhythm.      Pulses: Normal pulses.      Heart sounds: Normal heart sounds.   Pulmonary:      Effort: Pulmonary effort is normal.      Breath sounds: Normal breath sounds.   Abdominal:      General: Abdomen is flat. Bowel sounds are normal.      Palpations: Abdomen is soft.      Tenderness: There  is no abdominal tenderness.   Musculoskeletal:      Cervical back: Normal range of motion. No bony tenderness. No pain with movement. Normal range of motion.      Thoracic back: No bony tenderness. Normal range of motion.      Lumbar back: Tenderness (paraspinal, mild) present. No bony tenderness. Normal  range of motion. Negative right straight leg raise test and negative left straight leg raise test.      Right lower leg: No edema.      Left lower leg: No edema.   Lymphadenopathy:      Cervical: No cervical adenopathy.   Skin:     General: Skin is warm.      Capillary Refill: Capillary refill takes less than 2 seconds.   Neurological:      General: No focal deficit present.      Mental Status: He is alert and oriented to person, place, and time.   Psychiatric:         Mood and Affect: Mood normal.         Behavior: Behavior normal.     BP 112/78 (Site: Left Upper Arm, Position: Sitting, Cuff Size: Medium Adult)    Pulse 67    Resp 16    Ht  (1.854 m)    Wt 165 lb 3.2 oz (74.9 kg)    SpO2 98%    BMI 21.80 kg/m??     Assessment:       ICD-10-CM    1. Recurrent low back pain  M54.50 XR LUMBAR SPINE (2-3 VIEWS)     tiZANidine (ZANAFLEX) 2 MG tablet     naproxen (NAPROSYN) 250 MG tablet      2. Cervical spine pain  M54.2 XR CERVICAL SPINE (2-3 VIEWS)      3. Type 1 diabetes mellitus without complication (HCC)  E10.9       4. Mixed hyperlipidemia  E78.2                Plan:      1,2.  Patient with recurrent low back and cervical spine pain.  We discussed the importance of posture, regular stretching, strengthening.  Plan for baseline x-rays.  He is given prescription naproxen and Zanaflex with instructions and side effects.  I discussed looking up physical therapy exercises and doing these on a regular basis.  If pain persist we will consider MRI versus referral to orthopedics.  No red flag symptoms.  We discussed if any radicular pain, numbness, tingling, weakness, change in bowel or bladder function he is to notify the office immediately.  3.  Patient to continue following with endocrinology for management of type 1 diabetes.  4.  Patient to continue atorvastatin 10 mg for management of hyperlipidemia.    Return in about 4 months (around 07/19/2022) for physical.    Orders Placed This Encounter   Procedures    XR  LUMBAR SPINE (2-3 VIEWS)     Standing Status:   Future     Standing Expiration Date:   03/20/2023    XR CERVICAL SPINE (2-3 VIEWS)     Standing Status:   Future     Standing Expiration Date:   03/20/2023         Patient given educational materials - see patient instructions.  Discussed use, benefit, and side effects of prescribedmedications.  All patient questions answered. Pt voiced understanding. Reviewed health maintenance.  Instructed to continue current medications, diet and exercise.  Patient agreed with  treatment plan. Follow up as directed.        Electronically signed by Madelyn Brunner, PA-C on 03/19/2022 at 11:22 AM

## 2022-04-03 ENCOUNTER — Encounter: Payer: PRIVATE HEALTH INSURANCE | Attending: Physician Assistant | Primary: Physician Assistant

## 2022-04-05 ENCOUNTER — Encounter

## 2022-04-06 ENCOUNTER — Encounter

## 2022-04-06 MED ORDER — ATORVASTATIN CALCIUM 10 MG PO TABS
10 MG | ORAL_TABLET | ORAL | 1 refills | Status: DC
Start: 2022-04-06 — End: 2022-08-14

## 2022-04-06 MED ORDER — FREESTYLE LIBRE 14 DAY SENSOR MISC
2 refills | Status: DC
Start: 2022-04-06 — End: 2022-05-04

## 2022-04-06 MED ORDER — BASAGLAR KWIKPEN 100 UNIT/ML SC SOPN
100 UNIT/ML | SUBCUTANEOUS | 5 refills | Status: DC
Start: 2022-04-06 — End: 2022-04-07

## 2022-04-06 NOTE — Telephone Encounter (Signed)
From: Maryellen Pile  To: Raymond Gurney  Sent: 04/05/2022 10:30 AM EDT  Subject: Running out of Basaglar    Dr. Thersa Salt,    I'm starting to run out of the Basaglar insulin. Can you please refill this medication?     Thank you.

## 2022-04-06 NOTE — Telephone Encounter (Signed)
Last Visit Date: 03/19/2022   Next Visit Date: 07/24/2022

## 2022-04-06 NOTE — Telephone Encounter (Signed)
Last Visit Date: 03/19/2022   Next Visit Date: 04/06/2022

## 2022-04-07 MED ORDER — BASAGLAR KWIKPEN 100 UNIT/ML SC SOPN
100 UNIT/ML | SUBCUTANEOUS | 5 refills | Status: DC
Start: 2022-04-07 — End: 2022-05-27

## 2022-04-07 NOTE — Telephone Encounter (Signed)
Last Visit Date: 03/19/2022   Next Visit Date: 04/06/2022

## 2022-04-07 NOTE — Addendum Note (Signed)
Addended by: HANF, [place] on: 04/07/2022 09:42 AM     Modules accepted: Orders

## 2022-04-07 NOTE — Telephone Encounter (Signed)
Please pend medication. Thank you

## 2022-04-10 ENCOUNTER — Encounter: Payer: PRIVATE HEALTH INSURANCE | Attending: Physician Assistant | Primary: Physician Assistant

## 2022-05-04 ENCOUNTER — Encounter

## 2022-05-04 NOTE — Telephone Encounter (Signed)
Last visit: 03/19/22  Last Med refill: 10/14/21  Does patient have enough medication for 72 hours: Next Visit Date:  Future Appointments   Date Time Provider Department Center   07/24/2022  1:00 PM Raymond Gurney, PA-C [place] [facility]       Health Maintenance   Topic Date Due    COVID-19 Vaccine (1) Never done    Varicella vaccine (1 of 2 - 2-dose childhood series) Never done    Pneumococcal 0-64 years Vaccine (1 - PCV) Never done    Lipids  Never done    HPV vaccine (1 - Male 2-dose series) Never done    HIV screen  Never done    Diabetic retinal exam  Never done    GFR test (Diabetes, CKD 3-4, OR last GFR 15-59)  Never done    Hepatitis C screen  Never done    Hepatitis B vaccine (1 of 3 - Risk 3-dose series) Never done    DTaP/Tdap/Td vaccine (1 - Tdap) Never done    Diabetic foot exam  05/29/2022    Diabetic Alb to Cr ratio (uACR) test  05/29/2022    A1C test (Diabetic or Prediabetic)  09/05/2022    Depression Screen  03/20/2023    Flu vaccine  Completed    Hepatitis A vaccine  Aged Out    Hib vaccine  Aged Out    Meningococcal (ACWY) vaccine  Aged Out       Hemoglobin A1C (%)   Date Value   09/05/2021 8.2   05/29/2021 7.0             ( goal A1C is < 7)   No results found for: LABMICR  No results found for: LDLCHOLESTEROL, LDLCALC    (goal LDL is <100)   No results found for: AST, ALT, BUN, CR  BP Readings from Last 3 Encounters:   03/19/22 112/78   12/12/21 124/84   08/29/21 114/72          (goal 120/80)    All Future Testing planned in [facility]              Patient Active Problem List:     Type 1 diabetes mellitus without complication (HCC)     Mixed hyperlipidemia

## 2022-05-04 NOTE — Telephone Encounter (Signed)
Last Visit Date: 03/19/22  Next Visit Date: 07/24/22

## 2022-05-05 MED ORDER — BD PEN NEEDLE MICRO U/F 32G X 6 MM MISC
4 refills | Status: DC
Start: 2022-05-05 — End: 2022-05-27

## 2022-05-05 MED ORDER — FREESTYLE LIBRE 14 DAY SENSOR MISC
2 refills | Status: DC
Start: 2022-05-05 — End: 2022-05-27

## 2022-05-05 MED ORDER — INSULIN PEN NEEDLE 32G X 4 MM MISC
Freq: Every day | 2 refills | Status: DC
Start: 2022-05-05 — End: 2022-05-27

## 2022-05-27 ENCOUNTER — Encounter

## 2022-05-27 MED ORDER — INSULIN PEN NEEDLE 32G X 4 MM MISC
Freq: Every day | 2 refills | Status: AC
Start: 2022-05-27 — End: 2022-12-08

## 2022-05-27 MED ORDER — BD PEN NEEDLE MICRO U/F 32G X 6 MM MISC
4 refills | Status: AC
Start: 2022-05-27 — End: 2022-07-07

## 2022-05-27 MED ORDER — BASAGLAR KWIKPEN 100 UNIT/ML SC SOPN
100 UNIT/ML | SUBCUTANEOUS | 5 refills | Status: AC
Start: 2022-05-27 — End: 2024-01-25

## 2022-05-27 MED ORDER — NOVOLOG FLEXPEN 100 UNIT/ML SC SOPN
100 UNIT/ML | Freq: Three times a day (TID) | SUBCUTANEOUS | 0 refills | Status: AC
Start: 2022-05-27 — End: ?

## 2022-05-27 MED ORDER — FREESTYLE LIBRE 14 DAY SENSOR MISC
2 refills | Status: DC
Start: 2022-05-27 — End: 2022-06-29

## 2022-05-27 NOTE — Telephone Encounter (Signed)
Please contact his endocrinologist to see if this is a letter they can make for patient.  Also, confirm with them that he has not had any episodes of DKA.    Thank you.

## 2022-05-27 NOTE — Telephone Encounter (Signed)
From: Maryellen Pile  To: Raymond Gurney  Sent: 05/27/2022 1:21 PM EDT  Subject: Medical History Request    Hello Dr. Kenard Gower,    So I applied for life insurance with [employer] and I was denied because I had a history of Ketoacidosis along with my Type 1 Diabetes. I don't recall ever having Ketoacidosis or any severe complications with my Diabetes, only being made aware of it. I just wanted to confirm with you if I've ever had it, and if not, then please correct my medical records, along with a written statement saying I don't nor have I ever had Ketoacidosis.    Thank you,  Maryellen Pile

## 2022-05-27 NOTE — Telephone Encounter (Signed)
Last Visit Date: 03/19/2022   Next Visit Date: 07/24/2022

## 2022-05-27 NOTE — Telephone Encounter (Signed)
Last Visit Date: 03/19/2022  Next Visit Date: 07/24/2022

## 2022-05-27 NOTE — Telephone Encounter (Signed)
Last Visit Date: 03/19/2022   Next Visit Date: 05/27/2022

## 2022-05-27 NOTE — Telephone Encounter (Signed)
Call and could not leave VM for patient to call the office back phone just rang no VM

## 2022-05-27 NOTE — Telephone Encounter (Signed)
Writer contacted [facility] spoke with someone at the front desk I believe and advised her of previous messages, she transferred me to the nurse there and Probation officer got the voicemail. Left a voicemail for the nurse to return the call to the office.

## 2022-05-27 NOTE — Telephone Encounter (Signed)
I would recommend obtaining note from endocrinology since they are managing T1D.  Ketoacidosis is typically a consequence of uncontrolled sugars.    Brian Moses

## 2022-05-27 NOTE — Telephone Encounter (Signed)
Patient called stating he was denied life insurance because he has ketoacidosis.    Pt states he was never informed this and would like to know if he does have ketoacidosis.    IF patient does not he would like to know PCP can write up a letter to be submitted to insurance company so he can receive life insurance.

## 2022-05-28 NOTE — Telephone Encounter (Signed)
Writer contacted [facility] again with no response and left another message for them to call back.

## 2022-05-28 NOTE — Telephone Encounter (Signed)
Received call from Rosey Bath at [employer], who was returning a call from [facility].  She was uncertain why we called and was advised that patient is looking for a letter to give to [facility] stating that he does not have a history of ketoacidosis.  Rosey Bath advised Clinical research associate they they could not write this letter without receiving something directly from the life insurance company.  Her number is [phone].

## 2022-05-28 NOTE — Telephone Encounter (Signed)
Please see other encounter ( patient message )

## 2022-05-28 NOTE — Telephone Encounter (Signed)
Patient called back into office, advised of what has been discussed. Patient will contact life insurance and give them [facility] name and number to contact.

## 2022-05-28 NOTE — Telephone Encounter (Signed)
Attempted to contact patient, vm full, sent mychart message to patient to call office back to advise of [facility] cannot write letter unless they have a letter directly from [employer].

## 2022-06-16 ENCOUNTER — Encounter

## 2022-06-29 ENCOUNTER — Encounter

## 2022-06-29 MED ORDER — FREESTYLE LIBRE 14 DAY SENSOR MISC
2 refills | Status: DC
Start: 2022-06-29 — End: 2022-08-03

## 2022-06-29 NOTE — Telephone Encounter (Signed)
Last Visit Date: 03/19/2022   Next Visit Date: 06/29/2022

## 2022-07-01 ENCOUNTER — Ambulatory Visit
Admit: 2022-07-01 | Discharge: 2022-07-01 | Payer: PRIVATE HEALTH INSURANCE | Attending: Physician Assistant | Primary: Physician Assistant

## 2022-07-01 DIAGNOSIS — M542 Cervicalgia: Secondary | ICD-10-CM

## 2022-07-01 NOTE — Telephone Encounter (Signed)
From: Brian Moses  To: Raymond Gurney  Sent: 07/01/2022 2:38 PM EDT  Subject: Questions I Forgot to Ask    Cheral Marker,    I forgot to ask if you think I could still work out as long as I avoid hitting my traps/upper back/rear deltoids, or just work out lower body only, or just take a break for some time.    Also, I've been trying to order the Basaglar night time insulin but it's been on hold at [facility] for some time. I'm close to running out so what should I do?    Thanks,  Brian Moses

## 2022-07-01 NOTE — Progress Notes (Signed)
[facility]  [facility]  [address]  [address]  [place] [address]  Dept: [phone]  Dept Fax: [phone]    Brian Moses is a 26 y.o. male who presents today for his medical conditions/complaints as noted below.    Chief Complaint   Patient presents with    Neck Pain     Still lingering x 2 months, has used OTC and prescribed medications with some relief        HPI:     Patient presents to the office for evaluation of recurrent neck pain.   Neck pain has been ongoing for the last several months.  He describes pain into the bilateral traps and upper back equally.  Pain does not radiate down upper extremities.  There is no numbness or tingling.  Pain is worse with lifting and poor posture.  He has been doing home exercises, PT directed exercises at home doing.  He has been using anti-inflammatories and muscle relaxers with minimal benefit.  Lower back seems to be back to baseline without significant limitations.  He would like for further evaluation of neck pain.    No other concerns or complaints.  BP stable.      Hemoglobin A1C (%)   Date Value   09/05/2021 8.2   05/29/2021 7.0             ( goal A1C is < 7)   No results found for: LABMICR  No results found for: LDLCHOLESTEROL, LDLCALC    (goal LDL is <100)   No results found for: AST, ALT, BUN, CR  BP Readings from Last 3 Encounters:   07/01/22 120/70   03/19/22 112/78   12/12/21 124/84          (goal 120/80)    Past Medical History:   Diagnosis Date    Diabetes mellitus type 1 (HCC)       History reviewed. No pertinent surgical history.    History reviewed. No pertinent family history.    Social History     Tobacco Use    Smoking status: Never    Smokeless tobacco: Never   Substance Use Topics    Alcohol use: Never      Current Outpatient Medications   Medication Sig Dispense Refill    Continuous Blood Gluc Sensor (FREESTYLE LIBRE 14 DAY SENSOR) MISC Use as directed for glucose monitoring 2 each 2    BASAGLAR  KWIKPEN 100 UNIT/ML injection pen INJECT 10 UNITS SUBCUTANEOUS 1X PER DAY 90 DAYS 5 Adjustable Dose Pre-filled Pen Syringe 5    insulin aspart (NOVOLOG FLEXPEN) 100 UNIT/ML injection pen Inject 4 Units into the skin 3 times daily (before meals) 5 Adjustable Dose Pre-filled Pen Syringe 0    Insulin Pen Needle 32G X 4 MM MISC 1 each by Does not apply route daily 100 each 2    BD PEN NEEDLE MICRO U/F 32G X 6 MM MISC USE AS DIRECTED 4 TIMES A DAY FOR 90 DAYS 100 each 4    atorvastatin (LIPITOR) 10 MG tablet TAKE 1 TABLET BY MOUTH EVERY DAY 90 tablet 1    tiZANidine (ZANAFLEX) 2 MG tablet Take 1 tablet by mouth 3 times daily as needed (muscle spasms) 30 tablet 0    naproxen (NAPROSYN) 250 MG tablet Take 1 tablet by mouth 2 times daily as needed for Pain 60 tablet 0    Insulin Pen Needle 32G X 4 MM MISC as directed  No current facility-administered medications for this visit.     No Known Allergies    Health Maintenance   Topic Date Due    COVID-19 Vaccine (1) Never done    Varicella vaccine (1 of 2 - 2-dose childhood series) Never done    Pneumococcal 0-64 years Vaccine (1 - PCV) Never done    Lipids  Never done    HPV vaccine (1 - Male 2-dose series) Never done    HIV screen  Never done    Diabetic retinal exam  Never done    GFR test (Diabetes, CKD 3-4, OR last GFR 15-59)  Never done    Hepatitis C screen  Never done    Hepatitis B vaccine (1 of 3 - Risk 3-dose series) Never done    DTaP/Tdap/Td vaccine (1 - Tdap) Never done    Diabetic foot exam  05/29/2022    Diabetic Alb to Cr ratio (uACR) test  05/29/2022    Flu vaccine (1) 07/28/2022    A1C test (Diabetic or Prediabetic)  09/05/2022    Depression Screen  07/02/2023    Hepatitis A vaccine  Aged Out    Hib vaccine  Aged Out    Meningococcal (ACWY) vaccine  Aged Out       Subjective:      Review of Systems   Constitutional:  Negative for chills, fatigue and fever.   HENT:  Negative for congestion, rhinorrhea and sinus pain.    Eyes:  Negative for pain.    Respiratory:  Negative for cough and shortness of breath.    Cardiovascular:  Negative for chest pain and leg swelling.   Gastrointestinal:  Negative for abdominal pain, constipation, diarrhea, nausea and vomiting.   Genitourinary:  Negative for difficulty urinating, enuresis and testicular pain.   Musculoskeletal:  Positive for neck pain. Negative for arthralgias, back pain and myalgias.   Skin:  Negative for rash.   Neurological:  Negative for dizziness and headaches.   Psychiatric/Behavioral:  Negative for confusion and sleep disturbance. The patient is not nervous/anxious.    All other systems reviewed and are negative.    Objective:     Physical Exam  Vitals and nursing note reviewed.   Constitutional:       General: He is not in acute distress.     Appearance: Normal appearance. He is normal weight.   HENT:      Head: Normocephalic.      Mouth/Throat:      Mouth: Mucous membranes are moist.   Eyes:      Extraocular Movements: Extraocular movements intact.      Conjunctiva/sclera: Conjunctivae normal.      Pupils: Pupils are equal, round, and reactive to light.   Neck:      Comments: Strength of upper extremities is 5 out of 5 and equal.  Strength of lower extremities is 5 out of 5 and equal.  Cardiovascular:      Rate and Rhythm: Normal rate and regular rhythm.      Pulses: Normal pulses.      Heart sounds: Normal heart sounds.   Pulmonary:      Effort: Pulmonary effort is normal. No respiratory distress.      Breath sounds: Normal breath sounds.   Abdominal:      General: Abdomen is flat.   Musculoskeletal:      Cervical back: Normal range of motion. No torticollis. Muscular tenderness present. No pain with movement or spinous process tenderness. Normal range of motion.  Right lower leg: No edema.      Left lower leg: No edema.   Lymphadenopathy:      Cervical: No cervical adenopathy.   Skin:     General: Skin is warm.      Capillary Refill: Capillary refill takes less than 2 seconds.   Neurological:       General: No focal deficit present.      Mental Status: He is alert and oriented to person, place, and time.   Psychiatric:         Mood and Affect: Mood normal.         Behavior: Behavior normal.     BP 120/70 (Site: Left Upper Arm, Position: Sitting, Cuff Size: Medium Adult)   Pulse 76   Resp 16   Ht 6\' 1"  (1.854 m)   Wt 160 lb 9.6 oz (72.8 kg)   SpO2 98%   BMI 21.19 kg/m     Assessment:       ICD-10-CM    1. Cervical spine pain  M54.2 MRI CERVICAL SPINE WO CONTRAST               Plan:      1.  Patient with recurrent cervical spine pain despite conservative treatment including medications, home exercises, PT stretches, time.  Plan for MRI.  Pending MRI we will consider pain management versus orthopedics.  We discussed other conservative treatment including massage therapy, chiropractor.  No red flag symptoms.    Return if symptoms worsen or fail to improve.    Orders Placed This Encounter   Procedures    MRI CERVICAL SPINE WO CONTRAST     Standing Status:   Future     Standing Expiration Date:   07/02/2023         Patient given educational materials - see patient instructions.  Discussed use, benefit, and side effects of prescribed medications.  All patient questions answered. Pt voiced understanding. Reviewed health maintenance.  Instructed to continue current medications, diet and exercise.  Patient agreed with treatment plan. Follow up as directed.        Electronically signed by 09/02/2023, PA-C on 07/01/2022 at 2:05 PM

## 2022-07-07 ENCOUNTER — Encounter

## 2022-07-07 MED ORDER — BD PEN NEEDLE MICRO U/F 32G X 6 MM MISC
1 refills | Status: AC
Start: 2022-07-07 — End: ?

## 2022-07-10 LAB — HEMOGLOBIN A1C: Hemoglobin A1C: 6 %

## 2022-07-10 LAB — MICROALBUMIN / CREATININE URINE RATIO
Creatinine, Urine: 56.18
Microalb, Ur: <0.7

## 2022-07-22 ENCOUNTER — Ambulatory Visit: Payer: PRIVATE HEALTH INSURANCE | Primary: Physician Assistant

## 2022-07-22 DIAGNOSIS — M542 Cervicalgia: Secondary | ICD-10-CM

## 2022-08-03 ENCOUNTER — Encounter

## 2022-08-04 MED ORDER — FREESTYLE LIBRE 14 DAY SENSOR MISC
2 refills | Status: DC
Start: 2022-08-04 — End: 2022-09-01

## 2022-08-04 NOTE — Telephone Encounter (Signed)
Last Visit Date: 07/01/2022  Next Visit Date: 08/07/2022

## 2022-08-07 ENCOUNTER — Encounter: Payer: PRIVATE HEALTH INSURANCE | Attending: Physician Assistant | Primary: Physician Assistant

## 2022-08-14 ENCOUNTER — Ambulatory Visit
Admit: 2022-08-14 | Discharge: 2022-08-14 | Payer: PRIVATE HEALTH INSURANCE | Attending: Physician Assistant | Primary: Physician Assistant

## 2022-08-14 DIAGNOSIS — Z0000 Encounter for general adult medical examination without abnormal findings: Secondary | ICD-10-CM

## 2022-08-14 MED ORDER — FAMOTIDINE 20 MG PO TABS
20 MG | ORAL_TABLET | Freq: Every evening | ORAL | 2 refills | Status: AC
Start: 2022-08-14 — End: 2022-12-08

## 2022-08-14 NOTE — Progress Notes (Signed)
[facility]  [facility] [place]  [address]  [place] [address]  Dept: [phone]  Dept Fax: [phone]    Brian Moses is a 26 y.o. male who presents today for his medical conditions/complaints as noted below.    Chief Complaint   Patient presents with    Annual Exam     HA1C 6.0 on 07/10/2022       HPI:     Patient presents to the office for annual physical exam.  He has past medical history including type 1 diabetes.  He is following with endocrinology for management of type 1 diabetes.  He is stable on current insulin regimen.  Last A1c is 6.0.  Microalbumin was within normal limits.    Today, patient reports he is doing fairly well without any new or acute concerns.  Cervical spine pain is improving with time.  He is working on stretching and massage.  MRI was within normal limits.  Recently he was seen by urgent care for acid reflux.  There was started on famotidine.  This is working well.  Denies any lightheadedness, dizziness, shortness of breath, chest pain, leg edema, syncope.    Blood pressure stable.  Weight stable.      Hemoglobin A1C (%)   Date Value   07/10/2022 6.0   09/05/2021 8.2   05/29/2021 7.0             ( goal A1C is < 7)   No components found for: LABMICR  No results found for: LDLCHOLESTEROL, LDLCALC    (goal LDL is <100)   No results found for: AST, ALT, BUN, CR  BP Readings from Last 3 Encounters:   08/14/22 120/72   07/01/22 120/70   03/19/22 112/78          (goal 120/80)    Past Medical History:   Diagnosis Date    Diabetes mellitus type 1 (HCC)       History reviewed. No pertinent surgical history.    Family History   Problem Relation Age of Onset    Other Maternal Aunt     Colon Cancer Maternal Aunt     Colon Cancer Paternal Grandmother        Social History     Tobacco Use    Smoking status: Never    Smokeless tobacco: Never   Substance Use Topics    Alcohol use: Never      Current Outpatient Medications   Medication Sig Dispense Refill     famotidine (PEPCID) 20 MG tablet Take 1 tablet by mouth at bedtime 30 tablet 2    Continuous Blood Gluc Sensor (FREESTYLE LIBRE 14 DAY SENSOR) MISC Use as directed for glucose monitoring 2 each 2    BD PEN NEEDLE MICRO U/F 32G X 6 MM MISC USE AS DIRECTED 4 TIMES A DAY 400 each 1    BASAGLAR KWIKPEN 100 UNIT/ML injection pen INJECT 10 UNITS SUBCUTANEOUS 1X PER DAY 90 DAYS 5 Adjustable Dose Pre-filled Pen Syringe 5    insulin aspart (NOVOLOG FLEXPEN) 100 UNIT/ML injection pen Inject 4 Units into the skin 3 times daily (before meals) 5 Adjustable Dose Pre-filled Pen Syringe 0    Insulin Pen Needle 32G X 4 MM MISC 1 each by Does not apply route daily 100 each 2    tiZANidine (ZANAFLEX) 2 MG tablet Take 1 tablet by mouth 3 times daily as needed (muscle spasms) 30 tablet 0    naproxen (NAPROSYN) 250 MG  tablet Take 1 tablet by mouth 2 times daily as needed for Pain 60 tablet 0    Insulin Pen Needle 32G X 4 MM MISC as directed       No current facility-administered medications for this visit.     No Known Allergies    Health Maintenance   Topic Date Due    COVID-19 Vaccine (1) Never done    Varicella vaccine (1 of 2 - 2-dose childhood series) Never done    Pneumococcal 0-64 years Vaccine (1 - PCV) Never done    Lipids  Never done    HPV vaccine (1 - Male 2-dose series) Never done    HIV screen  Never done    Diabetic retinal exam  Never done    GFR test (Diabetes, CKD 3-4, OR last GFR 15-59)  Never done    Hepatitis C screen  Never done    Hepatitis B vaccine (1 of 3 - Risk 3-dose series) Never done    DTaP/Tdap/Td vaccine (1 - Tdap) Never done    Diabetic foot exam  05/29/2022    Flu vaccine (1) 07/28/2022    A1C test (Diabetic or Prediabetic)  07/11/2023    Diabetic Alb to Cr ratio (uACR) test  07/11/2023    Depression Screen  08/15/2023    Hepatitis A vaccine  Aged Out    Hib vaccine  Aged Out    Meningococcal (ACWY) vaccine  Aged Out       Subjective:      Review of Systems   Constitutional:  Negative for chills,  fatigue and fever.   HENT:  Negative for congestion, rhinorrhea and sinus pain.    Eyes:  Negative for pain.   Respiratory:  Negative for cough and shortness of breath.    Cardiovascular:  Negative for chest pain and leg swelling.   Gastrointestinal:  Negative for abdominal pain, constipation, diarrhea, nausea and vomiting.   Genitourinary:  Negative for difficulty urinating, enuresis and testicular pain.   Musculoskeletal:  Negative for arthralgias, back pain and myalgias.   Skin:  Negative for rash.   Neurological:  Negative for dizziness and headaches.   Psychiatric/Behavioral:  Negative for confusion and sleep disturbance. The patient is not nervous/anxious.    All other systems reviewed and are negative.    Objective:     Physical Exam  Vitals and nursing note reviewed.   Constitutional:       General: He is not in acute distress.     Appearance: Normal appearance. He is normal weight.   HENT:      Head: Normocephalic.      Mouth/Throat:      Mouth: Mucous membranes are moist.   Eyes:      Extraocular Movements: Extraocular movements intact.      Conjunctiva/sclera: Conjunctivae normal.      Pupils: Pupils are equal, round, and reactive to light.   Cardiovascular:      Rate and Rhythm: Normal rate and regular rhythm.      Pulses: Normal pulses.      Heart sounds: Normal heart sounds.   Pulmonary:      Effort: Pulmonary effort is normal. No respiratory distress.      Breath sounds: Normal breath sounds.   Abdominal:      General: Abdomen is flat. Bowel sounds are normal.      Palpations: Abdomen is soft.      Tenderness: There is no abdominal tenderness.   Musculoskeletal:  Cervical back: Normal range of motion.      Right lower leg: No edema.      Left lower leg: No edema.   Lymphadenopathy:      Cervical: No cervical adenopathy.   Skin:     General: Skin is warm.      Capillary Refill: Capillary refill takes less than 2 seconds.   Neurological:      General: No focal deficit present.      Mental Status: He  is alert and oriented to person, place, and time.      Cranial Nerves: No cranial nerve deficit.      Motor: No weakness.      Gait: Gait normal.   Psychiatric:         Mood and Affect: Mood normal.         Behavior: Behavior normal.     BP 120/72 (Site: Left Upper Arm, Position: Sitting, Cuff Size: Medium Adult)   Pulse 76   Resp 16   Ht 6\' 1"  (1.854 m)   Wt 158 lb 12.8 oz (72 kg)   SpO2 98%   BMI 20.95 kg/m     Assessment:       ICD-10-CM    1. Annual physical exam  Z00.00       2. Gastroesophageal reflux disease, unspecified whether esophagitis present  K21.9 famotidine (PEPCID) 20 MG tablet      3. Type 1 diabetes mellitus without complication (HCC)  E10.9                Plan:      1.  Patient presents the office for annual physical exam.  We reviewed health maintenance and he is up-to-date.  I discussed the importance of regular physical activity and healthy nutrition.  2.  Patient will continue famotidine 20 mg at nighttime for acid reflux.  Stable.  3.  Patient will continue following with endocrinology for management of type 1 diabetes.  Last A1C was well controlled.    Return in about 1 year (around 08/15/2023) for physical.    No orders of the defined types were placed in this encounter.        Patient given educational materials - see patient instructions.  Discussed use, benefit, and side effects of prescribed medications.  All patient questions answered. Pt voiced understanding. Reviewed health maintenance.  Instructed to continue current medications, diet and exercise.  Patient agreed with treatment plan. Follow up as directed.        Electronically signed by 08/17/2023, PA-C on 08/14/2022 at 2:50 PM

## 2022-08-17 ENCOUNTER — Encounter

## 2022-09-01 ENCOUNTER — Encounter

## 2022-09-01 MED ORDER — FREESTYLE LIBRE 14 DAY SENSOR MISC
2 refills | Status: DC
Start: 2022-09-01 — End: 2022-09-04

## 2022-09-01 NOTE — Telephone Encounter (Signed)
Last Visit Date: 08/14/2022   Next Visit Date: 08/20/2023

## 2022-09-04 ENCOUNTER — Encounter

## 2022-09-04 MED ORDER — FREESTYLE LIBRE 14 DAY SENSOR MISC
2 refills | Status: DC
Start: 2022-09-04 — End: 2022-10-31

## 2022-09-04 NOTE — Telephone Encounter (Signed)
Last Visit Date: 08/14/2022   Next Visit Date: 08/20/2023

## 2022-10-12 ENCOUNTER — Encounter

## 2022-10-12 MED ORDER — NOVOLOG FLEXPEN 100 UNIT/ML SC SOPN
100 UNIT/ML | Freq: Three times a day (TID) | SUBCUTANEOUS | 0 refills | Status: DC
Start: 2022-10-12 — End: 2023-01-01

## 2022-10-12 NOTE — Telephone Encounter (Signed)
Last Visit Date: 08/14/2022   Next Visit Date: 08/20/2023

## 2022-10-14 NOTE — Assessment & Plan Note (Signed)
Associated Problem(s): Mixed hyperlipidemia  Formatting of this note might be different from the original.  LDL cholesterol above 100 but no need for statin therapy given patient's age  Electronically signed by Geni Bers, MD at 10/14/2022 12:28 PM EDT

## 2022-10-14 NOTE — Progress Notes (Signed)
Formatting of this note is different from the original.      Type 1 Diabetes follow up    Brian Moses is a 26 y.o. male  whom I am following for type 1 diabetes. The patient has been diabetic since March 2022.  Normal detectable C-peptide with positive GAD antibodies. Symptoms were polyuria, polydipsia, 10 lb weight loss for 1 month. BS was 900 mg/dl.  Apparently no DKA according to the patient but reports are not available. Levemir resulted in hypoglycemia. Currently taking Basaglar 10 units qhs, dose increased from 2 units 1 week ago and Novolog 1:20 with breakfast, 1:15 g with lunch and 1 unit for every 10 g with supper up to 4 units per meal. Hemoglobin A1c obtained at the office today was 6.3% up from 5.9% down from 7.2% of the last visit. The patient has a Libre sensor showing early morning hypoglycemia and some postprandial spikes. Diet is good for the most part, watching CHO's. Exercising every day.    The patient had a baseline ophthalmologic exam in September 2022, no retinopathy, has occasional sharp feet pain and normal albumin-ratio in July 2023.    Past Medical History:   Diagnosis Date    Type 1 diabetes (CMS-HCC)        Current Outpatient Medications:     famotidine (PEPCID) 40 mg tablet, Take 0.5 tablets (20 mg total) by mouth nightly as needed for heartburn., Disp: 30 tablet, Rfl: 0    FREESTYLE LIBRE 14 DAY SENSOR kit, USE AS DIRECTED FOR GLUCOSE MONITORING, Disp: , Rfl:     insulin aspart U-100 (NovoLOG) 100 unit/mL injection, Inject 0.06 mL (6 Units total) under the skin in the morning and 0.06 mL (6 Units total) at noon and 0.06 mL (6 Units total) in the evening. Inject before meals. Indications: type 1 diabetes mellitus. Is taking on a 1:15 carb ratio at meals per endocrinologist., Disp: , Rfl:     insulin glargine (LANTUS, BASAGLAR) 100 unit/mL (3 mL) insulin pen, Inject 4 Units under the skin nightly Indications: type 1 diabetes mellitus., Disp: 4 mL, Rfl: 3     multivit-iron-FA-calcium &mins (THERAGRAN-M) 9 mg iron-400 mcg tablet, Take 1 tablet by mouth in the morning., Disp: , Rfl:     pen needle, diabetic (BD NANO 2ND GEN PEN NEEDLE) 32 gauge x 5/32" needle, Use four times daily as needed for insulin for diabetes, Disp: 60 each, Rfl: 0     Review of Systems   Constitutional: Negative.    HENT: Negative.     Eyes: Negative.    Respiratory: Negative.     Cardiovascular:  Negative for chest pain, palpitations and leg swelling.   Endocrine: Negative for heat intolerance, polydipsia, polyphagia and polyuria.   Genitourinary: Negative.    Skin: Negative.    Allergic/Immunologic: Negative.    Hematological: Negative.    Psychiatric/Behavioral: Negative.         Tobacco Use: Low Risk  (10/14/2022)    Patient History     Smoking Tobacco Use: Never     Smokeless Tobacco Use: Never     Passive Exposure: Not on file       History reviewed. No pertinent surgical history.         No Known Allergies     History reviewed. No pertinent family history.     Physical examination    Vitals:    10/14/22 1204   BP: 122/77   Pulse: 85   Weight: 75.8 kg (167 lb  3.2 oz)         Physical Exam  Constitutional:       Appearance: Normal appearance.   HENT:      Head: Normocephalic.   Eyes:      Extraocular Movements: Extraocular movements intact.      Pupils: Pupils are equal, round, and reactive to light.      Comments: No exophalmos   Neck:      Thyroid: No thyroid mass, thyromegaly or thyroid tenderness.      Vascular: No carotid bruit.   Cardiovascular:      Rate and Rhythm: Normal rate and regular rhythm.      Pulses:           Dorsalis pedis pulses are 3+ on the right side and 3+ on the left side.   Pulmonary:      Effort: No respiratory distress.      Breath sounds: Normal breath sounds. No wheezing.   Abdominal:      General: Bowel sounds are normal.      Palpations: Abdomen is soft.      Tenderness: There is no abdominal tenderness.   Musculoskeletal:         General: No swelling or  tenderness.      Cervical back: No rigidity or tenderness.      Right lower leg: No edema.      Left lower leg: No edema.      Right foot: No deformity, Charcot foot or foot drop.      Left foot: No deformity, Charcot foot or foot drop.   Feet:      Right foot:      Protective Sensation: 5 sites tested.  5 sites sensed.      Left foot:      Protective Sensation: 5 sites tested.  5 sites sensed.   Lymphadenopathy:      Cervical: No cervical adenopathy.   Skin:     General: Skin is warm and dry.   Neurological:      General: No focal deficit present.      Mental Status: He is alert and oriented to person, place, and time.      Cranial Nerves: No cranial nerve deficit.      Sensory: No sensory deficit.      Motor: No weakness.      Gait: Gait normal.      Needs sensor prescription    Lab Results   Component Value Date    EXTPOCA1C 6.3 10/14/2022    GLU 96 04/08/2022     Lab Results   Component Value Date    TSH 1.73 08/06/2021    T4 1.06 08/06/2021    CREATININE 1.26 02/06/2022    GFR >60 08/06/2021    GFR >60 08/06/2021    CHOL 192 02/06/2022    LDLCALC 108 02/06/2022    HDL 52 02/06/2022    TRIG 158 (H) 02/06/2022    MICROALBUR <0.7 07/10/2022    VITD25 30.6 08/06/2021      .    ASSESSMENT:     1. Type 1 diabetes mellitus without complication (CMS-HCC)  - POCT Hemoglobin A1c      Mixed hyperlipidemia  LDL cholesterol above 100 but no need for statin therapy given patient's age    Type 1 diabetes mellitus without complication (CMS-HCC)  Brian Moses is a 26 y.o. male with type 1 diabetes well controlled based on hemoglobin A1c and blood sugars.  Sensor shows  nocturnal Hypoglycemia so we will decrease Lantus to 8 units. Patient was just advised to watch his diet more carefully and exercise on a regular basis. Patient will continue following with Ophthalmology regularly and will come back for follow-up in 3 months.      Electronically signed by Geni Bers, MD at 10/14/2022 12:31 PM EDT

## 2022-10-14 NOTE — Assessment & Plan Note (Signed)
Associated Problem(s): Type 1 diabetes mellitus without complication (CMS-HCC)  Formatting of this note might be different from the original.  Brian Moses is a 26 y.o. male with type 1 diabetes well controlled based on hemoglobin A1c and blood sugars.  Sensor shows nocturnal Hypoglycemia so we will decrease Lantus to 8 units. Patient was just advised to watch his diet more carefully and exercise on a regular basis. Patient will continue following with Ophthalmology regularly and will come back for follow-up in 3 months.    Electronically signed by Geni Bers, MD at 10/14/2022 12:30 PM EDT

## 2022-10-31 ENCOUNTER — Encounter

## 2022-10-31 NOTE — Telephone Encounter (Signed)
Last Visit Date: 07/01/2022  Next Visit Date: Visit date not found

## 2022-11-02 MED ORDER — FREESTYLE LIBRE 14 DAY SENSOR MISC
2 refills | Status: DC
Start: 2022-11-02 — End: 2022-11-26

## 2022-11-26 ENCOUNTER — Encounter

## 2022-11-26 NOTE — Telephone Encounter (Signed)
Last Visit Date: 08/14/2022   Next Visit Date: 08/20/2023

## 2022-11-27 MED ORDER — FREESTYLE LIBRE 14 DAY SENSOR MISC
2 refills | Status: DC
Start: 2022-11-27 — End: 2022-12-08

## 2022-12-08 ENCOUNTER — Encounter

## 2022-12-08 NOTE — Telephone Encounter (Signed)
Last Visit Date: 08/14/2022   Next Visit Date: 12/08/2022

## 2022-12-09 MED ORDER — INSULIN PEN NEEDLE 32G X 4 MM MISC
Freq: Every day | 5 refills | Status: AC
Start: 2022-12-09 — End: ?

## 2022-12-09 MED ORDER — FREESTYLE LIBRE 14 DAY SENSOR MISC
2 refills | Status: DC
Start: 2022-12-09 — End: 2023-09-30

## 2022-12-09 MED ORDER — FAMOTIDINE 20 MG PO TABS
20 MG | ORAL_TABLET | Freq: Every evening | ORAL | 5 refills | Status: DC
Start: 2022-12-09 — End: 2023-02-15

## 2022-12-09 NOTE — Telephone Encounter (Signed)
Last Visit Date: 08/14/2022   Next Visit Date: 08/20/2023

## 2023-01-01 ENCOUNTER — Encounter

## 2023-01-02 NOTE — Telephone Encounter (Signed)
Last Visit Date: 08/14/2022   Next Visit Date: 08/20/2023

## 2023-01-04 MED ORDER — NOVOLOG FLEXPEN 100 UNIT/ML SC SOPN
100 UNIT/ML | Freq: Three times a day (TID) | SUBCUTANEOUS | 0 refills | Status: DC
Start: 2023-01-04 — End: 2023-02-15

## 2023-01-15 NOTE — Progress Notes (Signed)
Formatting of this note is different from the original.  Images from the original note were not included.    Type 1 Diabetes follow up    Brian Moses is a 27 y.o. male  whom I am following for type 1 diabetes. The patient has been diabetic since March 2022.  Normal detectable C-peptide with positive GAD antibodies. Symptoms were polyuria, polydipsia, 10 lb weight loss for 1 month. BS was 900 mg/dl.  Apparently no DKA according to the patient but reports are not available. Levemir resulted in hypoglycemia. Currently taking Basaglar 4 units qhs and Novolog 1:10 with breakfast, 1:12 g with lunch and 1:12 g with supper. Hemoglobin A1c obtained at the office today was 6.2%, from 6.3% at the last visit. The patient has a Libre sensor showing some postprandial spikes. Diet is good for the most part, watching CHO's. Exercising every day.    The patient is updated on ophthalmologic exams, last in January 2024- no retinopathy. Has occasional sharp feet pain, but no numbness or tinlging. Normal albumin-ratio in July 2023.    Libre    Past Medical History:   Diagnosis Date    Type 1 diabetes (CMS-HCC)        Current Outpatient Medications:     famotidine (PEPCID) 40 mg tablet, Take 0.5 tablets (20 mg total) by mouth nightly as needed for heartburn., Disp: 30 tablet, Rfl: 0    FREESTYLE LIBRE 14 DAY SENSOR kit, USE AS DIRECTED FOR GLUCOSE MONITORING, Disp: , Rfl:     insulin aspart U-100 (NovoLOG) 100 unit/mL injection, Inject 0.06 mL (6 Units total) under the skin in the morning and 0.06 mL (6 Units total) at noon and 0.06 mL (6 Units total) in the evening. Inject before meals. Indications: type 1 diabetes mellitus. Is taking on a 1:15 carb ratio at meals per endocrinologist., Disp: , Rfl:     insulin glargine (LANTUS, BASAGLAR) 100 unit/mL (3 mL) insulin pen, Inject 4 Units under the skin nightly Indications: type 1 diabetes mellitus., Disp: 4 mL, Rfl: 3    multivit-iron-FA-calcium &mins (THERAGRAN-M) 9 mg iron-400  mcg tablet, Take 1 tablet by mouth in the morning., Disp: , Rfl:     pen needle, diabetic (BD NANO 2ND GEN PEN NEEDLE) 32 gauge x 5/32" needle, Use four times daily as needed for insulin for diabetes, Disp: 400 each, Rfl: 3     Review of Systems   Constitutional: Negative.    HENT: Negative.     Eyes: Negative.    Respiratory: Negative.     Cardiovascular:  Negative for chest pain, palpitations and leg swelling.   Endocrine: Negative for heat intolerance, polydipsia, polyphagia and polyuria.   Genitourinary: Negative.    Skin: Negative.    Allergic/Immunologic: Negative.    Hematological: Negative.    Psychiatric/Behavioral: Negative.         Tobacco Use: Low Risk  (01/15/2023)    Patient History     Smoking Tobacco Use: Never     Smokeless Tobacco Use: Never     Passive Exposure: Not on file       History reviewed. No pertinent surgical history.         No Known Allergies     History reviewed. No pertinent family history.     Physical examination    Vitals:    01/15/23 1358   BP: 138/75   Pulse: 87   Weight: 79.4 kg (175 lb)   Height: 185.4 cm (6\' 1" )  Physical Exam  Constitutional:       Appearance: Normal appearance.   HENT:      Head: Normocephalic.   Eyes:      Extraocular Movements: Extraocular movements intact.      Pupils: Pupils are equal, round, and reactive to light.      Comments: No exophalmos   Neck:      Thyroid: No thyroid mass, thyromegaly or thyroid tenderness.      Vascular: No carotid bruit.   Cardiovascular:      Rate and Rhythm: Normal rate and regular rhythm.      Pulses:           Dorsalis pedis pulses are 3+ on the right side and 3+ on the left side.   Pulmonary:      Effort: No respiratory distress.      Breath sounds: Normal breath sounds. No wheezing.   Abdominal:      General: Bowel sounds are normal.      Palpations: Abdomen is soft.      Tenderness: There is no abdominal tenderness.   Musculoskeletal:         General: No swelling or tenderness.      Cervical back: No rigidity or  tenderness.      Right lower leg: No edema.      Left lower leg: No edema.      Right foot: No deformity, Charcot foot or foot drop.      Left foot: No deformity, Charcot foot or foot drop.   Feet:      Right foot:      Protective Sensation: 5 sites tested.  5 sites sensed.      Left foot:      Protective Sensation: 5 sites tested.  5 sites sensed.   Lymphadenopathy:      Cervical: No cervical adenopathy.   Skin:     General: Skin is warm and dry.   Neurological:      General: No focal deficit present.      Mental Status: He is alert and oriented to person, place, and time.      Cranial Nerves: No cranial nerve deficit.      Sensory: No sensory deficit.      Motor: No weakness.      Gait: Gait normal.     Lab Results   Component Value Date    EXTPOCA1C 6.2 01/15/2023    EXTPOCA1C 6.3 10/14/2022    EXTPOCA1C 6.0 07/10/2022     CMP:  Lab Results   Component Value Date    SODIUM 140 02/06/2022    K 3.6 02/06/2022    CL 101 02/06/2022    CO2 30 02/06/2022    ANIONGAP 9 02/06/2022    BUN 24 (H) 02/06/2022    CREATININE 1.26 02/06/2022    GLU 96 04/08/2022    CALCIUM 9.2 02/06/2022    TOTALPROTEI 7.2 02/06/2022    ALBUMIN 4.5 02/06/2022    ALKPHOS 50 02/06/2022    AST 54 (H) 02/06/2022    ALT 56 (H) 02/06/2022    EGFR 82 02/06/2022     LIPID:  Lab Results   Component Value Date    CHOL 192 02/06/2022    TRIG 158 (H) 02/06/2022    HDL 52 02/06/2022    LDLCALC 108 02/06/2022     Urine Microalbumin:   Lab Results   Component Value Date    MICROALBUR <0.7 07/10/2022    URINECREAT 56.18 07/10/2022  ALBCREATRA NOT CALCULATED 07/10/2022     TSH   Date Value Ref Range Status   08/06/2021 1.73 0.49 - 4.67 uIU/mL Final     T4, free   Date Value Ref Range Status   08/06/2021 1.06 0.61 - 1.60 ng/dL Final     Vit D, 25-Hydroxy   Date Value Ref Range Status   08/06/2021 30.6 30 - 100 ng/mL Final     Comment:     Vitamin D status         25 OH Vitamin D  ---------------------------------------------  Deficiency                   <20  ng/mL  Insufficiency              20-29 ng/mL  Sufficiency               30-100 ng/mL  Toxicity                    >100 ng/mL  NOTE: A pediatric reference range has not been  established by the manufacturer of this kit.  The American Academy of Pediatrics recommends  a Vitamin D level of = or >20ng/mL in infants  and children.      C peptide   Date Value Ref Range Status   08/06/2021 1.80 0.81 - 3.85 ng/mL Final     Comment:     NOTE      Test Performed by     Test Performed By: [facility]    [address], [place] [address]    Laboratory Director: Gerilyn Nestle, MD, PhD    CLIA #[id number]^      Gad Antibody   Date Value Ref Range Status   08/06/2021 175.3 (H) 0.0 - 5.0 IU/mL Final     Comment:     NOTE  INTERPRETIVE INFORMATION:  Glutamic Acid Decarboxylase Antibody    A value greater than 5.0 IU/mL is considered positive for Glutamic   Acid Decarboxylase Antibody (GAD Ab). This assay is intended for   the semi-quantitative determination of the GAD Ab in human serum.   Results should be interpreted within the context of clinical   symptoms.  Performed By: [facility]  [address] [place] [address]  Laboratory Director: Jordan Likes, MD, PhD      ASSESSMENT:     1. Type 1 diabetes mellitus without complication (CMS-HCC)  - POCT Hemoglobin A1c  - pen needle, diabetic (BD NANO 2ND GEN PEN NEEDLE) 32 gauge x 5/32" needle; Use four times daily as needed for insulin for diabetes  Dispense: 400 each; Refill: 3    2. Mixed hyperlipidemia      Brian Moses is a 27 y.o. male with type 1 diabetes well controlled based on hemoglobin A1c and blood sugars.  No medication changes today. He is interested in pump therapy, will research Tandem T-Slim and Omnipod 5 and notify the office which one he would prefer. Patient was advised to watch his diet more carefully and exercise on a regular basis. Patient will continue following with Ophthalmology regularly and will come  back for follow-up in 3 months.        Cleotilde Neer, APRN-CNP  01/15/23 1438    Electronically signed by Cleotilde Neer, APRN-CNP at 01/15/2023  2:38 PM EST

## 2023-01-15 NOTE — Progress Notes (Signed)
Formatting of this note is different from the original.  Images from the original note were not included.    Type 1 Diabetes follow up    Brian Moses is a 27 y.o. male  whom I am following for type 1 diabetes. The patient has been diabetic since March 2022.  Normal detectable C-peptide with positive GAD antibodies. Symptoms were polyuria, polydipsia, 10 lb weight loss for 1 month. BS was 900 mg/dl.  Apparently no DKA according to the patient but reports are not available. Levemir resulted in hypoglycemia. Currently taking Basaglar 4 units qhs and Novolog 1:10 with breakfast, 1:12 g with lunch and 1:12 g with supper. Hemoglobin A1c obtained at the office today was 6.2%, from 6.3% at the last visit. The patient has a Libre sensor showing some postprandial spikes. Diet is good for the most part, watching CHO's. Exercising every day.    The patient is updated on ophthalmologic exams, last in January 2024- no retinopathy. Has occasional sharp feet pain, but no numbness or tinlging. Normal albumin-ratio in July 2023.    Libre    Past Medical History:   Diagnosis Date    Type 1 diabetes (CMS-HCC)        Current Outpatient Medications:     famotidine (PEPCID) 40 mg tablet, Take 0.5 tablets (20 mg total) by mouth nightly as needed for heartburn., Disp: 30 tablet, Rfl: 0    FREESTYLE LIBRE 14 DAY SENSOR kit, USE AS DIRECTED FOR GLUCOSE MONITORING, Disp: , Rfl:     insulin aspart U-100 (NovoLOG) 100 unit/mL injection, Inject 0.06 mL (6 Units total) under the skin in the morning and 0.06 mL (6 Units total) at noon and 0.06 mL (6 Units total) in the evening. Inject before meals. Indications: type 1 diabetes mellitus. Is taking on a 1:15 carb ratio at meals per endocrinologist., Disp: , Rfl:     insulin glargine (LANTUS, BASAGLAR) 100 unit/mL (3 mL) insulin pen, Inject 4 Units under the skin nightly Indications: type 1 diabetes mellitus., Disp: 4 mL, Rfl: 3    multivit-iron-FA-calcium &mins (THERAGRAN-M) 9 mg iron-400  mcg tablet, Take 1 tablet by mouth in the morning., Disp: , Rfl:     pen needle, diabetic (BD NANO 2ND GEN PEN NEEDLE) 32 gauge x 5/32" needle, Use four times daily as needed for insulin for diabetes, Disp: 400 each, Rfl: 3     Review of Systems   Constitutional: Negative.    HENT: Negative.     Eyes: Negative.    Respiratory: Negative.     Cardiovascular:  Negative for chest pain, palpitations and leg swelling.   Endocrine: Negative for heat intolerance, polydipsia, polyphagia and polyuria.   Genitourinary: Negative.    Skin: Negative.    Allergic/Immunologic: Negative.    Hematological: Negative.    Psychiatric/Behavioral: Negative.         Tobacco Use: Low Risk  (01/15/2023)    Patient History     Smoking Tobacco Use: Never     Smokeless Tobacco Use: Never     Passive Exposure: Not on file       History reviewed. No pertinent surgical history.         No Known Allergies     History reviewed. No pertinent family history.     Physical examination    Vitals:    01/15/23 1358   BP: 138/75   Pulse: 87   Weight: 79.4 kg (175 lb)   Height: 185.4 cm (6\' 1" )  Physical Exam  Constitutional:       Appearance: Normal appearance.   HENT:      Head: Normocephalic.   Eyes:      Extraocular Movements: Extraocular movements intact.      Pupils: Pupils are equal, round, and reactive to light.      Comments: No exophalmos   Neck:      Thyroid: No thyroid mass, thyromegaly or thyroid tenderness.      Vascular: No carotid bruit.   Cardiovascular:      Rate and Rhythm: Normal rate and regular rhythm.      Pulses:           Dorsalis pedis pulses are 3+ on the right side and 3+ on the left side.   Pulmonary:      Effort: No respiratory distress.      Breath sounds: Normal breath sounds. No wheezing.   Abdominal:      General: Bowel sounds are normal.      Palpations: Abdomen is soft.      Tenderness: There is no abdominal tenderness.   Musculoskeletal:         General: No swelling or tenderness.      Cervical back: No rigidity or  tenderness.      Right lower leg: No edema.      Left lower leg: No edema.      Right foot: No deformity, Charcot foot or foot drop.      Left foot: No deformity, Charcot foot or foot drop.   Feet:      Right foot:      Protective Sensation: 5 sites tested.  5 sites sensed.      Left foot:      Protective Sensation: 5 sites tested.  5 sites sensed.   Lymphadenopathy:      Cervical: No cervical adenopathy.   Skin:     General: Skin is warm and dry.   Neurological:      General: No focal deficit present.      Mental Status: He is alert and oriented to person, place, and time.      Cranial Nerves: No cranial nerve deficit.      Sensory: No sensory deficit.      Motor: No weakness.      Gait: Gait normal.     Lab Results   Component Value Date    EXTPOCA1C 6.2 01/15/2023    EXTPOCA1C 6.3 10/14/2022    EXTPOCA1C 6.0 07/10/2022     CMP:  Lab Results   Component Value Date    SODIUM 140 02/06/2022    K 3.6 02/06/2022    CL 101 02/06/2022    CO2 30 02/06/2022    ANIONGAP 9 02/06/2022    BUN 24 (H) 02/06/2022    CREATININE 1.26 02/06/2022    GLU 96 04/08/2022    CALCIUM 9.2 02/06/2022    TOTALPROTEI 7.2 02/06/2022    ALBUMIN 4.5 02/06/2022    ALKPHOS 50 02/06/2022    AST 54 (H) 02/06/2022    ALT 56 (H) 02/06/2022    EGFR 82 02/06/2022     LIPID:  Lab Results   Component Value Date    CHOL 192 02/06/2022    TRIG 158 (H) 02/06/2022    HDL 52 02/06/2022    LDLCALC 108 02/06/2022     Urine Microalbumin:   Lab Results   Component Value Date    MICROALBUR <0.7 07/10/2022    URINECREAT 56.18 07/10/2022      ALBCREATRA NOT CALCULATED 07/10/2022     TSH   Date Value Ref Range Status   08/06/2021 1.73 0.49 - 4.67 uIU/mL Final     T4, free   Date Value Ref Range Status   08/06/2021 1.06 0.61 - 1.60 ng/dL Final     Vit D, 25-Hydroxy   Date Value Ref Range Status   08/06/2021 30.6 30 - 100 ng/mL Final     Comment:     Vitamin D status         25 [place] Vitamin D  ---------------------------------------------  Deficiency                   <20  ng/mL  Insufficiency              20-29 ng/mL  Sufficiency               30-100 ng/mL  Toxicity                    >100 ng/mL  NOTE: A pediatric reference range has not been  established by the manufacturer of this kit.  The American Academy of Pediatrics recommends  a Vitamin D level of = or >20ng/mL in infants  and children.      C peptide   Date Value Ref Range Status   08/06/2021 1.80 0.81 - 3.85 ng/mL Final     Comment:     NOTE      Test Performed by     Test Performed By: [facility]    [address], [place] [address]    Laboratory Director: Gerilyn Nestle, MD, PhD    CLIA #[id number]^      Gad Antibody   Date Value Ref Range Status   08/06/2021 175.3 (H) 0.0 - 5.0 IU/mL Final     Comment:     NOTE  INTERPRETIVE INFORMATION:  Glutamic Acid Decarboxylase Antibody    A value greater than 5.0 IU/mL is considered positive for Glutamic   Acid Decarboxylase Antibody (GAD Ab). This assay is intended for   the semi-quantitative determination of the GAD Ab in human serum.   Results should be interpreted within the context of clinical   symptoms.  Performed By: [facility]  [address] [place] [address]  Laboratory Director: Jordan Likes, MD, PhD      ASSESSMENT:     1. Type 1 diabetes mellitus without complication (CMS-HCC)  - POCT Hemoglobin A1c  - pen needle, diabetic (BD NANO 2ND GEN PEN NEEDLE) 32 gauge x 5/32" needle; Use four times daily as needed for insulin for diabetes  Dispense: 400 each; Refill: 3    2. Mixed hyperlipidemia      Brian Moses is a 27 y.o. male with type 1 diabetes well controlled based on hemoglobin A1c and blood sugars.  No medication changes today. He is interested in pump therapy, will research Tandem T-Slim and Omnipod 5 and notify the office which one he would prefer. Patient was advised to watch his diet more carefully and exercise on a regular basis. Patient will continue following with Ophthalmology regularly and will come  back for follow-up in 3 months.        Cleotilde Neer, APRN-CNP  01/15/23 1438    Electronically signed by Cleotilde Neer, APRN-CNP at 01/15/2023  2:38 PM EST

## 2023-01-27 ENCOUNTER — Ambulatory Visit
Admit: 2023-01-27 | Discharge: 2023-01-27 | Payer: PRIVATE HEALTH INSURANCE | Attending: Physician Assistant | Primary: Physician Assistant

## 2023-01-27 DIAGNOSIS — K219 Gastro-esophageal reflux disease without esophagitis: Secondary | ICD-10-CM

## 2023-01-27 DIAGNOSIS — R0789 Other chest pain: Secondary | ICD-10-CM

## 2023-01-27 MED ORDER — HYDROXYZINE HCL 50 MG PO TABS
50 MG | ORAL_TABLET | ORAL | 0 refills | Status: AC | PRN
Start: 2023-01-27 — End: 2023-02-26

## 2023-01-27 NOTE — ED Triage Notes (Signed)
Mode of arrival (squad #, walk in, police, etc) : walk in        Chief complaint(s): shortness of breath, nasal congestion, chest pain, anxiety        Arrival Note (brief scenario, treatment PTA, etc).: Pt reports that he had cold and flu like symptoms this weekend. Pt stated that he has had SOB for the last 3 days. Pt reports that he is also having indigestion, chest pain, nasal congestion, and anxiety for the last 3 days.         C= "Have you ever felt that you should Cut down on your drinking?"  No  A= "Have people Annoyed you by criticizing your drinking?"  No  G= "Have you ever felt bad or Guilty about your drinking?"  No  E= "Have you ever had a drink as an Eye-opener first thing in the morning to steady your nerves or to help a hangover?"  No      Deferred []       Reason for deferring: N/A    *If yes to two or more: probable alcohol abuse.*

## 2023-01-27 NOTE — ED Provider Notes (Signed)
EMERGENCY DEPARTMENT ENCOUNTER    Pt Name: Brian Moses  MRN: [id number]  Birthdate 1996/08/16  Date of evaluation: 01/28/23  CHIEF COMPLAINT       Chief Complaint   Patient presents with    Shortness of Breath    Nasal Congestion    Chest Pain    Anxiety     HISTORY OF PRESENT ILLNESS   27 year old male presents for reports of chest pain shortness of breath and feeling anxious.  Patient reports that earlier today he was having chest pain, was feeling very short of breath and very anxious.  He states that it was nothing specific that brought it on.  He states he has been feeling better since that time.  He reports that when he was having the pain it was in the center of his chest described as an aching pain he denies any significant past medical history    The history is provided by the patient.           REVIEW OF SYSTEMS     Review of Systems   Constitutional:  Negative for chills and fever.   HENT:  Negative for congestion and ear pain.    Eyes:  Negative for pain.   Respiratory:  Positive for shortness of breath.    Cardiovascular:  Positive for chest pain. Negative for palpitations and leg swelling.   Gastrointestinal:  Negative for abdominal pain.   Genitourinary:  Negative for dysuria and flank pain.   Musculoskeletal:  Negative for back pain.   Skin:  Negative for color change.   Neurological:  Negative for numbness and headaches.   Psychiatric/Behavioral:  Negative for confusion.    All other systems reviewed and are negative.    PASTMEDICAL HISTORY     Past Medical History:   Diagnosis Date    Diabetes mellitus type 1 (HCC)      Past Problem List  Patient Active Problem List   Diagnosis Code    Type 1 diabetes mellitus without complication (HCC) E10.9    Mixed hyperlipidemia E78.2     SURGICAL HISTORY     History reviewed. No pertinent surgical history.  CURRENT MEDICATIONS       Discharge Medication List as of 01/28/2023  3:13 AM        CONTINUE these medications which have NOT CHANGED    Details   hydrOXYzine  HCl (ATARAX) 50 MG tablet Take 1 tablet by mouth as needed for Anxiety, Disp-10 tablet, R-0Normal      insulin aspart (NOVOLOG FLEXPEN) 100 UNIT/ML injection pen Inject 4 Units into the skin 3 times daily (before meals), Disp-5 Adjustable Dose Pre-filled Pen Syringe, R-0Normal      Continuous Blood Gluc Sensor (FREESTYLE LIBRE 14 DAY SENSOR) MISC Use as directed for glucose monitoring, Disp-2 each, R-2Normal      !! Insulin Pen Needle 32G X 4 MM MISC DAILY Starting Wed 12/09/2022, Disp-100 each, R-5, Normal      famotidine (PEPCID) 20 MG tablet Take 1 tablet by mouth at bedtime, Disp-30 tablet, R-5Normal      !! BD PEN NEEDLE MICRO U/F 32G X 6 MM MISC USE AS DIRECTED 4 TIMES A DAY, Disp-400 each, R-1, DAWNormal      BASAGLAR KWIKPEN 100 UNIT/ML injection pen INJECT 10 UNITS SUBCUTANEOUS 1X PER DAY 90 DAYS, Disp-5 Adjustable Dose Pre-filled Pen Syringe, R-5, DAWNormal      tiZANidine (ZANAFLEX) 2 MG tablet Take 1 tablet by mouth 3 times daily as needed (muscle  spasms), Disp-30 tablet, R-0Normal      naproxen (NAPROSYN) 250 MG tablet Take 1 tablet by mouth 2 times daily as needed for Pain, Disp-60 tablet, R-0Normal      !! Insulin Pen Needle 32G X 4 MM MISC Historical Med       !! - Potential duplicate medications found. Please discuss with provider.        ALLERGIES     has No Known Allergies.  FAMILY HISTORY     He indicated that the status of his paternal grandmother is unknown. He indicated that the status of his maternal aunt is unknown.     SOCIAL HISTORY       Social History     Tobacco Use    Smoking status: Never    Smokeless tobacco: Never   Substance Use Topics    Alcohol use: Never    Drug use: Never     PHYSICAL EXAM     INITIAL VITALS: BP 127/83   Pulse 58   Temp 97.6 F (36.4 C) (Oral)   Resp 20   Ht 1.829 m (6')   Wt 76.7 kg (169 lb)   SpO2 99%   BMI 22.92 kg/m    Physical Exam  Vitals and nursing note reviewed.   Constitutional:       General: He is not in acute distress.     Appearance:  Normal appearance. He is not ill-appearing.   HENT:      Head: Normocephalic and atraumatic.      Right Ear: External ear normal.      Left Ear: External ear normal.      Nose: Nose normal.      Mouth/Throat:      Mouth: Mucous membranes are moist.   Eyes:      Extraocular Movements: Extraocular movements intact.      Pupils: Pupils are equal, round, and reactive to light.   Cardiovascular:      Rate and Rhythm: Normal rate and regular rhythm.      Pulses: Normal pulses.           Radial pulses are 2+ on the right side and 2+ on the left side.        Dorsalis pedis pulses are 2+ on the right side and 2+ on the left side.      Heart sounds: Normal heart sounds.   Pulmonary:      Effort: Pulmonary effort is normal.      Breath sounds: Normal breath sounds.   Abdominal:      General: Abdomen is flat.      Palpations: Abdomen is soft.      Tenderness: There is no abdominal tenderness.   Musculoskeletal:         General: No tenderness. Normal range of motion.      Cervical back: Neck supple. No spinous process tenderness or muscular tenderness.      Right lower leg: No edema.      Left lower leg: No edema.   Skin:     General: Skin is warm and dry.      Capillary Refill: Capillary refill takes less than 2 seconds.   Neurological:      General: No focal deficit present.      Mental Status: He is alert and oriented to person, place, and time.      Cranial Nerves: Cranial nerves 2-12 are intact.      Sensory: Sensation is intact.  Motor: Motor function is intact.   Psychiatric:         Attention and Perception: He does not perceive auditory or visual hallucinations.         Behavior: Behavior normal.         Thought Content: Thought content is not paranoid or delusional. Thought content does not include homicidal or suicidal ideation.         MEDICAL DECISION MAKING / ED COURSE:     27 year old male presents for complaints of chest pain and shortness of breath.  On initial exam patient no acute distress, vital  stable    1)  Number and Complexity of Problems Addressed at this Encounter  Problem List This Visit: Chest pain, shortness of breath    Differential Diagnosis: Atypical chest pain, musculoskeletal pain, anxiety    Diagnoses Considered but Do Not Suspect: ACS, PE    Pertinent Comorbid Conditions: None    2)  Data Reviewed and Analyzed  (Lab and radiology tests/orders below in next section)  My EKG interpretation: Sinus bradycardia rate of 51, normal axis, normal intervals, no ST segment elevation or depression, no T wave changes, EKG consistent with early repoll        Decision Rules/Scores utilized:  PERC Rule Negative  Age < 50 years  Pulse < 100 bpm  SaO2 > 94%  No unilateral leg swelling  No hemoptysis  No recent trauma or surgery  No prior PE or DVT  No hormone use       3)  Treatment and Disposition           Will check labs, x-ray    Labs reviewed and unremarkable, chest x-ray negative    History: 0  ECG: 0  Patient Age: 43  Risk Factors: 1  Troponin: 0  Heart Score Total: 1       Patient with a heart score of 1    Given that symptoms improved no significant findings on labs or imaging stable for discharge home    Results were discussed with patient discussed supportive care and need for follow-up with PCP and return precautions, patient voiced understanding comfortable plan and discharge    Patient/Guardian was informed of their diagnosis and told to follow up with PCP  in 1-3 days. Patient demonstrates understanding and agreement with the plan. They were given the opportunity to ask questions and those questions were answered to the best of our ability with the available information. Patient/Guardian told to return to the ED for any new, worsening, changing or persistent symptoms.       This dictation was prepared using Training and development officer.       Labs are reviewed and unremarkable      CRITICAL CARE:       PROCEDURES:    Procedures      DATA FOR LAB AND RADIOLOGY TESTS ORDERED BELOW ARE  REVIEWED BY THE ED CLINICIAN:    RADIOLOGY: All x-rays, CT, MRI, and formal ultrasound images (except ED bedside ultrasound) are read by the radiologist, see reports below, unless otherwise noted in MDM or here.  Reports below are reviewed by myself.  XR CHEST PORTABLE   Final Result   No acute cardiopulmonary process.             LABS: Lab orders shown below, the results are reviewed by myself, and all abnormals are listed below.  Labs Reviewed   CBC WITH AUTO DIFFERENTIAL - Abnormal; Notable for the following  components:       Result Value    Monocytes % 13 (*)     All other components within normal limits   COMPREHENSIVE METABOLIC PANEL - Abnormal; Notable for the following components:    Glucose 109 (*)     Creatinine 1.3 (*)     All other components within normal limits   LIPASE   MAGNESIUM   PROTIME-INR   TROPONIN   TROPONIN       Vitals Reviewed:    Vitals:    01/28/23 0001 01/28/23 0143 01/28/23 0156   BP: 137/78 127/83    Pulse: 58 53 58   Resp: 16 20    Temp: 97.6 F (36.4 C)     TempSrc: Oral     SpO2: 99% 99%    Weight: 76.7 kg (169 lb)     Height: 1.829 m (6')       MEDICATIONS GIVEN TO PATIENT THIS ENCOUNTER:  No orders of the defined types were placed in this encounter.    DISCHARGE PRESCRIPTIONS:  Discharge Medication List as of 01/28/2023  3:13 AM        PHYSICIAN CONSULTS ORDERED THIS ENCOUNTER:  None  FINAL IMPRESSION      1. Chest pain, unspecified type          DISPOSITION/PLAN   DISPOSITION Decision To Discharge 01/28/2023 02:44:30 AM      OUTPATIENT FOLLOW UP THE PATIENT:  Anselmo Pickler, PA-C  [address] [place] [address]  [phone]    Schedule an appointment as soon as possible for a visit       [facility]  [address]  [place] [address]  [phone]    As needed, If symptoms worsen      Grant Fontana, DO       Grant Fontana, [place]  01/28/23 [phone]

## 2023-01-27 NOTE — Progress Notes (Signed)
[facility]  [place] [address]  Dept: [phone]  Dept Fax: [phone]    Brian Moses is a 27 y.o. male who presents today for his medical conditions/complaints as noted below.    Chief Complaint   Patient presents with    Respiratory Distress     Was happening at night and gerd medication helped, now happening in morning as well and unsure of cause       HPI:     Patient presents to the office to discuss respiratory/breathing concerns/heartburn.  He states the past few days and especially last night he has noticed some increasing chest tightness/difficulty taking a full breath.  The past weekend he was experiencing some cold-like symptoms including cough and congestion.  There are no fevers or chills.  No shortness of breath or wheezing.  No history of asthma.  No cigarette use.  Chest tightness/difficulty taking a full breath is intermittent.  He is able to talk normal sentences.  No gasping.  He has used some over-the-counter Pepcid at night which has helped relieve his symptoms.  Although he is still having some similar symptoms in the morning.  There is no chest pain.  No radiating pain.  No lightheadedness or dizziness.      Additionally, he reports that he is feeling slightly anxious the last few days.  He feels that he is a little more stressed than normal.  He frequently has anxiety over his health.  No prior discussion or treatment for anxiety.  It does not impact him on a daily basis, only when he is focused on his health problems.    No other acute changes.  BP stable.  Pulse and O2 sats stable.        Hemoglobin A1C (%)   Date Value   07/10/2022 6.0   09/05/2021 8.2   05/29/2021 7.0             ( goal A1C is < 7)   No components found for: "LABMICR"  No results found for: "LDLCHOLESTEROL", "[place]"    (goal LDL is <100)   AST (U/L)   Date Value   01/28/2023 29     ALT (U/L)   Date Value   01/28/2023 17     BUN (mg/dL)   Date Value    01/28/2023 15     BP Readings from Last 3 Encounters:   01/28/23 127/83   01/27/23 126/88   08/14/22 120/72          (goal 120/80)    Past Medical History:   Diagnosis Date    Diabetes mellitus type 1 ([place])       History reviewed. No pertinent surgical history.    Family History   Problem Relation Age of Onset    Other Maternal Aunt     Colon Cancer Maternal Aunt     Colon Cancer Paternal Grandmother        Social History     Tobacco Use    Smoking status: Never    Smokeless tobacco: Never   Substance Use Topics    Alcohol use: Never      Current Outpatient Medications   Medication Sig Dispense Refill    hydrOXYzine HCl (ATARAX) 50 MG tablet Take 1 tablet by mouth as needed for Anxiety 10 tablet 0    insulin aspart (NOVOLOG FLEXPEN) 100 UNIT/ML injection pen Inject 4 Units into the skin 3 times daily (before  meals) 5 Adjustable Dose Pre-filled Pen Syringe 0    Continuous Blood Gluc Sensor (FREESTYLE LIBRE 14 DAY SENSOR) MISC Use as directed for glucose monitoring 2 each 2    Insulin Pen Needle 32G X 4 MM MISC 1 each by Does not apply route daily 100 each 5    famotidine (PEPCID) 20 MG tablet Take 1 tablet by mouth at bedtime 30 tablet 5    BD PEN NEEDLE MICRO U/F 32G X 6 MM MISC USE AS DIRECTED 4 TIMES A DAY 400 each 1    BASAGLAR KWIKPEN 100 UNIT/ML injection pen INJECT 10 UNITS SUBCUTANEOUS 1X PER DAY 90 DAYS 5 Adjustable Dose Pre-filled Pen Syringe 5    tiZANidine (ZANAFLEX) 2 MG tablet Take 1 tablet by mouth 3 times daily as needed (muscle spasms) 30 tablet 0    naproxen (NAPROSYN) 250 MG tablet Take 1 tablet by mouth 2 times daily as needed for Pain 60 tablet 0    Insulin Pen Needle 32G X 4 MM MISC as directed       No current facility-administered medications for this visit.     No Known Allergies    Health Maintenance   Topic Date Due    Hepatitis B vaccine (1 of 3 - 3-dose series) Never done    COVID-19 Vaccine (1) Never done    Varicella vaccine (1 of 2 - 2-dose childhood series) Never done     Pneumococcal 0-64 years Vaccine (1 - PCV) Never done    Lipids  Never done    HPV vaccine (1 - Male 2-dose series) Never done    HIV screen  Never done    Hepatitis C screen  Never done    DTaP/Tdap/Td vaccine (1 - Tdap) Never done    Diabetic foot exam  05/29/2022    Diabetic Alb to Cr ratio (uACR) test  05/29/2022    Flu vaccine (1) 01/28/2024 (Originally 07/28/2022)    A1C test (Diabetic or Prediabetic)  07/11/2023    Diabetic retinal exam  01/06/2024    Depression Screen  01/28/2024    GFR test (Diabetes, CKD 3-4, OR last GFR 15-59)  01/29/2024    Hepatitis A vaccine  Aged Out    Hib vaccine  Aged Out    Polio vaccine  Aged Out    Meningococcal (ACWY) vaccine  Aged Out       Subjective:      Review of Systems   Constitutional:  Negative for chills, fatigue and fever.   HENT:  Negative for congestion, rhinorrhea and sinus pain.    Eyes:  Negative for pain.   Respiratory:  Positive for chest tightness. Negative for cough, shortness of breath and wheezing.    Cardiovascular:  Negative for chest pain and leg swelling.   Gastrointestinal:  Negative for abdominal pain, constipation, diarrhea, nausea and vomiting.        + dyspepsia   Genitourinary:  Negative for difficulty urinating, enuresis and testicular pain.   Musculoskeletal:  Negative for arthralgias, back pain and myalgias.   Skin:  Negative for rash.   Neurological:  Negative for dizziness and headaches.   Psychiatric/Behavioral:  Negative for confusion and sleep disturbance. The patient is not nervous/anxious.    All other systems reviewed and are negative.      Objective:     Physical Exam  Constitutional:       General: He is not in acute distress.     Appearance: Normal appearance. He is normal weight.  HENT:      Head: Normocephalic.      Right Ear: External ear normal.      Left Ear: External ear normal.      Mouth/Throat:      Mouth: Mucous membranes are moist.   Eyes:      Extraocular Movements: Extraocular movements intact.      Conjunctiva/sclera:  Conjunctivae normal.      Pupils: Pupils are equal, round, and reactive to light.   Cardiovascular:      Rate and Rhythm: Normal rate and regular rhythm.      Pulses: Normal pulses.      Heart sounds: Normal heart sounds.   Pulmonary:      Effort: Pulmonary effort is normal. No respiratory distress.      Breath sounds: Normal breath sounds. No wheezing.   Abdominal:      General: Abdomen is flat. Bowel sounds are normal.      Palpations: Abdomen is soft.      Tenderness: There is no abdominal tenderness.   Musculoskeletal:      Cervical back: Normal range of motion.      Right lower leg: No edema.      Left lower leg: No edema.   Lymphadenopathy:      Cervical: No cervical adenopathy.   Skin:     General: Skin is warm.      Capillary Refill: Capillary refill takes less than 2 seconds.   Neurological:      General: No focal deficit present.      Mental Status: He is alert and oriented to person, place, and time.   Psychiatric:         Mood and Affect: Mood normal.         Behavior: Behavior normal.       BP 126/88 (Site: Left Upper Arm, Position: Sitting, Cuff Size: Medium Adult)   Pulse 60   Resp 16   Ht 1.854 m (6\' 1" )   Wt 76.7 kg (169 lb)   SpO2 97%   BMI 22.30 kg/m     Assessment:       ICD-10-CM    1. Gastroesophageal reflux disease, unspecified whether esophagitis present  K21.9       2. Anxiety about health  F41.8 hydrOXYzine HCl (ATARAX) 50 MG tablet               Plan:      1.  Patient with atypical GERD type symptoms.  Recommend Pepcid twice daily for one week and then gradually weaning.  If symptoms persist he is to notify the office.  We can consider pulmonary function testing.  2.  We discussed anxiety especially regarding health.  Recommend hydroxyzine as needed.  Discussed the importance of regular exercise, stress relieving activities such as meditation, journaling.  Discussed adequate sleep of 6-8 hours per night.  Trial nighttime magnesium 400 mg.  He will call us with any acute changes or  concerns.    Return if symptoms worsen or fail to improve.    No orders of the defined types were placed in this encounter.        Patient given educational materials - see patient instructions.  Discussed use, benefit, and side effects of prescribedmedications.  All patient questions answered. Pt voiced understanding. Reviewed health maintenance.  Instructed to continue current medications, diet and exercise.  Patient agreed with treatment plan. Follow up as directed.        Electronically signed by Dian Situ  [place], PA-C on 02/03/2023 at 1:44 PM

## 2023-01-27 NOTE — Telephone Encounter (Signed)
From: Arma Heading  To: Anselmo Pickler  Sent: 01/27/2023 6:36 AM EST  Subject: Difficulty breathing    Hi Dian Situ,    For the past few days, I've been having some difficulty breathing. It starts getting difficult around evening time and I'll start taking the Famotidine again and I'll feel better. But now I start having trouble breathing in the morning as well. Should I schedule a visit for you to look into this more or is there something you can tell me here? Not sure if it's gas-related or diabetes-related or what and it's starting to concern me.    Thank you

## 2023-01-28 ENCOUNTER — Emergency Department: Admit: 2023-01-28 | Payer: PRIVATE HEALTH INSURANCE | Primary: Physician Assistant

## 2023-01-28 ENCOUNTER — Inpatient Hospital Stay
Admit: 2023-01-28 | Discharge: 2023-01-28 | Disposition: A | Discharge status: Home / Self Care | Payer: PRIVATE HEALTH INSURANCE | Attending: Emergency Medicine

## 2023-01-28 DIAGNOSIS — R079 Chest pain, unspecified: Secondary | ICD-10-CM

## 2023-01-28 LAB — COMPREHENSIVE METABOLIC PANEL
ALT: 17 U/L (ref 5–41)
AST: 29 U/L (ref <40)
Albumin: 4.3 g/dL (ref 3.5–5.2)
Alkaline Phosphatase: 54 U/L (ref 40–129)
Anion Gap: 11 mmol/L (ref 9–17)
BUN: 15 mg/dL (ref 6–20)
CO2: 26 mmol/L (ref 20–31)
Calcium: 9.4 mg/dL (ref 8.6–10.4)
Chloride: 101 mmol/L (ref 98–107)
Creatinine: 1.3 mg/dL — ABNORMAL HIGH (ref 0.7–1.2)
Est, Glom Filt Rate: >60 mL/min/{1.73_m2} (ref >60)
Glucose: 109 mg/dL — ABNORMAL HIGH (ref 70–99)
Potassium: 3.7 mmol/L (ref 3.7–5.3)
Sodium: 138 mmol/L (ref 135–144)
Total Bilirubin: 0.3 mg/dL (ref 0.3–1.2)
Total Protein: 6.7 g/dL (ref 6.4–8.3)

## 2023-01-28 LAB — EKG 12-LEAD
Atrial Rate: 51 {beats}/min
Atrial Rate: 52 {beats}/min
P Axis: 26 degrees
P Axis: 35 degrees
P-R Interval: 156 ms
P-R Interval: 158 ms
Q-T Interval: 448 ms
Q-T Interval: 450 ms
QRS Duration: 88 ms
QRS Duration: 88 ms
QTc Calculation (Bazett): 414 ms
QTc Calculation (Bazett): 416 ms
R Axis: 68 degrees
R Axis: 68 degrees
T Axis: 52 degrees
T Axis: 56 degrees
Ventricular Rate: 51 {beats}/min
Ventricular Rate: 52 {beats}/min

## 2023-01-28 LAB — CBC WITH AUTO DIFFERENTIAL
Basophils %: 1 % (ref 0–2)
Basophils Absolute: 0.1 10*3/uL (ref 0.0–0.2)
Eosinophils %: 3 % (ref 0–4)
Eosinophils Absolute: 0.2 10*3/uL (ref 0.0–0.4)
Hematocrit: 42.8 % (ref 41–53)
Hemoglobin: 14.2 g/dL (ref 13.5–17.5)
Lymphocytes %: 34 % (ref 24–44)
Lymphocytes Absolute: 2.4 10*3/uL (ref 1.0–4.8)
MCH: 30.5 pg (ref 26–34)
MCHC: 33.2 g/dL (ref 31–37)
MCV: 91.7 fL (ref 80–100)
MPV: 9.1 fL (ref 6.0–12.0)
Monocytes %: 13 % — ABNORMAL HIGH (ref 1–7)
Monocytes Absolute: 0.9 10*3/uL (ref 0.1–1.3)
Neutrophils %: 49 % (ref 36–66)
Neutrophils Absolute: 3.4 10*3/uL (ref 1.3–9.1)
Platelets: 193 10*3/uL (ref 150–450)
RBC: 4.66 m/uL (ref 4.5–5.9)
RDW: 13.6 % (ref 11.5–14.9)
WBC: 6.9 10*3/uL (ref 3.5–11.0)

## 2023-01-28 LAB — TROPONIN
Troponin, High Sensitivity: 14 ng/L (ref 0–22)
Troponin, High Sensitivity: 13 ng/L (ref 0–22)

## 2023-01-28 LAB — PROTIME-INR
INR: 1.1
Protime: 14 s (ref 11.8–14.6)

## 2023-01-28 LAB — MAGNESIUM: Magnesium: 2.2 mg/dL (ref 1.6–2.6)

## 2023-01-28 LAB — LIPASE: Lipase: 22 U/L (ref 13–60)

## 2023-01-28 NOTE — ED Notes (Signed)
Discharge instructions reviewed with patient, noting all directions and education by provider. Patient verbalizes understanding of all information reviewed, gathered personal items, and transferred under own power off unit to lobby without incident.

## 2023-02-15 ENCOUNTER — Encounter

## 2023-02-15 NOTE — Telephone Encounter (Signed)
Last Visit Date: 01/27/2023   Next Visit Date: 02/15/2023

## 2023-02-16 MED ORDER — NOVOLOG FLEXPEN 100 UNIT/ML SC SOPN
100 UNIT/ML | Freq: Three times a day (TID) | SUBCUTANEOUS | 0 refills | Status: AC
Start: 2023-02-16 — End: 2023-02-18

## 2023-02-16 MED ORDER — FAMOTIDINE 20 MG PO TABS
20 | ORAL_TABLET | Freq: Every evening | ORAL | 5 refills | Status: AC
Start: 2023-02-16 — End: ?

## 2023-02-16 NOTE — Telephone Encounter (Signed)
Last Visit Date: 01/27/2023   Next Visit Date: 08/20/2023

## 2023-02-18 ENCOUNTER — Encounter

## 2023-02-19 MED ORDER — NOVOLOG FLEXPEN 100 UNIT/ML SC SOPN
100 UNIT/ML | Freq: Three times a day (TID) | SUBCUTANEOUS | 0 refills | Status: DC
Start: 2023-02-19 — End: 2023-03-15

## 2023-02-19 NOTE — Telephone Encounter (Signed)
Last Visit Date: 01/27/2023   Next Visit Date: 02/18/2023

## 2023-03-15 ENCOUNTER — Encounter

## 2023-03-15 MED ORDER — NOVOLOG FLEXPEN 100 UNIT/ML SC SOPN
100 | Freq: Three times a day (TID) | SUBCUTANEOUS | 0 refills | Status: AC
Start: 2023-03-15 — End: ?

## 2023-03-15 NOTE — Telephone Encounter (Signed)
Last Visit Date: 01/27/2023   Next Visit Date: 08/20/2023

## 2023-06-30 ENCOUNTER — Ambulatory Visit
Admit: 2023-06-30 | Discharge: 2023-06-30 | Payer: PRIVATE HEALTH INSURANCE | Attending: Physician Assistant | Primary: Physician Assistant

## 2023-06-30 DIAGNOSIS — G44219 Episodic tension-type headache, not intractable: Secondary | ICD-10-CM

## 2023-06-30 MED ORDER — NAPROXEN 250 MG PO TABS
250 | ORAL_TABLET | Freq: Two times a day (BID) | ORAL | 0 refills | Status: DC | PRN
Start: 2023-06-30 — End: 2023-07-29

## 2023-06-30 NOTE — Progress Notes (Signed)
[facility]  [facility] [place] [facility]  [address]  [place] [address]  Dept: [phone]  Dept Fax: [phone]    Brian Moses is a 27 y.o. male who presents today for his medical conditions/complaints as noted below.    Chief Complaint   Patient presents with    Headache     Patient states that he has had a headache for the past 2 weeks. Tried OTC tylenol, with minimal relief. Patient states that when he leaves his home, the headache goes away. But when he returns it comes back. Concerned about mold in his home.        HPI:     Pt has been seen and examined today.   Pt is complaining of headaches for the past 2.5 wks ago that have been constant.  Headaches are frontal.  No radiation.  Has tried tylenol but wears off and HA comes right back. He thinks he possibly has mold in his apt because the HA is relieved as soon as he leaves his apt. Cleaned apt last night and feels better this morning. No blurry vision, tinnitus, congestion, rhinorrhea, sore throat.  No neurological deficit.  Had had a hx of headaches intermittently but not constant like this. Has been drinking a ton of water. No changes in diet or changes in medications. No recent stressors.     Also concerned about spot in the middle of the tongue that appeared 1 wk ago, doesn't hurt but noticed it and wanted to bring it up.  No injury or trauma.  No itching.  No burning.  No pain.  No other lesions.  No other concerns today.    Headache  Headache pattern:  Some headache always there, and the pain level varies  Duration:  2 to 4 weeks  Frequency:  Headaches came more frequently then constant pain started  Initial event:  None  Providers seen:  None  Number of ER visits for headache:  0  Time of day symptoms are worse:  Upon waking up, morning and afternoon  Days of the week symptoms are worse:  No specific day of the week  Season symptoms are worse:  No particular season  Quality:  Squeezing and throbbing  Laterality:  Unable to  describe  Location:  Front/forehead      Hemoglobin A1C (%)   Date Value   07/10/2022 6.0   09/05/2021 8.2   05/29/2021 7.0             ( goal A1C is < 7)   No components found for: "LABMICR"  No components found for: "LDLCHOLESTEROL", "[place]"    (goal LDL is <100)   AST (U/L)   Date Value   01/28/2023 29     ALT (U/L)   Date Value   01/28/2023 17     BUN (mg/dL)   Date Value   30/16/0109 15     BP Readings from Last 3 Encounters:   06/30/23 122/80   01/28/23 127/83   01/27/23 126/88          (goal 120/80)    Past Medical History:   Diagnosis Date    Diabetes mellitus type 1 ([facility])       History reviewed. No pertinent surgical history.    Family History   Problem Relation Age of Onset    Other Maternal Aunt     Colon Cancer Maternal Aunt     Colon Cancer Paternal Grandmother  Social History     Tobacco Use    Smoking status: Never    Smokeless tobacco: Never   Substance Use Topics    Alcohol use: Never      Current Outpatient Medications   Medication Sig Dispense Refill    sucralfate (CARAFATE) 1 GM tablet 1 tablet on an empty stomach Orally Twice a day for 30 day(s)      naproxen (NAPROSYN) 250 MG tablet Take 1 tablet by mouth 2 times daily as needed for Pain 60 tablet 0    insulin aspart (NOVOLOG FLEXPEN) 100 UNIT/ML injection pen Inject 4 Units into the skin 3 times daily (before meals) 5 Adjustable Dose Pre-filled Pen Syringe 0    famotidine (PEPCID) 20 MG tablet Take 1 tablet by mouth at bedtime 30 tablet 5    Continuous Blood Gluc Sensor (FREESTYLE LIBRE 14 DAY SENSOR) MISC Use as directed for glucose monitoring 2 each 2    Insulin Pen Needle 32G X 4 MM MISC 1 each by Does not apply route daily 100 each 5    BD PEN NEEDLE MICRO U/F 32G X 6 MM MISC USE AS DIRECTED 4 TIMES A DAY 400 each 1    BASAGLAR KWIKPEN 100 UNIT/ML injection pen INJECT 10 UNITS SUBCUTANEOUS 1X PER DAY 90 DAYS 5 Adjustable Dose Pre-filled Pen Syringe 5    tiZANidine (ZANAFLEX) 2 MG tablet Take 1 tablet by mouth 3 times daily as  needed (muscle spasms) 30 tablet 0    Insulin Pen Needle 32G X 4 MM MISC as directed       No current facility-administered medications for this visit.     No Known Allergies    Health Maintenance   Topic Date Due    Hepatitis B vaccine (1 of 3 - 3-dose series) Never done    COVID-19 Vaccine (1) Never done    Varicella vaccine (1 of 2 - 2-dose childhood series) Never done    Pneumococcal 0-64 years Vaccine (1 of 2 - PCV) Never done    Lipids  Never done    HPV vaccine (1 - Male 2-dose series) Never done    HIV screen  Never done    Hepatitis C screen  Never done    DTaP/Tdap/Td vaccine (1 - Tdap) Never done    Diabetic foot exam  05/29/2022    Diabetic Alb to Cr ratio (uACR) test  05/29/2022    A1C test (Diabetic or Prediabetic)  07/11/2023    Flu vaccine (1) 07/29/2023    Diabetic retinal exam  01/06/2024    Depression Screen  01/28/2024    GFR test (Diabetes, CKD 3-4, OR last GFR 15-59)  01/29/2024    Hepatitis A vaccine  Aged Out    Hib vaccine  Aged Out    Polio vaccine  Aged Out    Meningococcal (ACWY) vaccine  Aged Out       Subjective:      Review of Systems   Constitutional:  Negative for chills, fatigue and fever.   HENT:  Negative for congestion, rhinorrhea and sinus pain.         + oral lesions   Eyes:  Negative for pain.   Respiratory:  Negative for cough and shortness of breath.    Cardiovascular:  Negative for chest pain and leg swelling.   Gastrointestinal:  Negative for abdominal pain, constipation, diarrhea, nausea and vomiting.   Genitourinary:  Negative for difficulty urinating, enuresis and testicular pain.   Musculoskeletal:  Negative  for arthralgias, back pain and myalgias.   Skin:  Negative for rash.   Neurological:  Positive for headaches. Negative for dizziness.   Psychiatric/Behavioral:  Negative for confusion and sleep disturbance. The patient is not nervous/anxious.    All other systems reviewed and are negative.      Objective:     Physical Exam  Vitals and nursing note reviewed.    Constitutional:       General: He is not in acute distress.     Appearance: Normal appearance. He is normal weight. He is not ill-appearing, toxic-appearing or diaphoretic.   HENT:      Head: Normocephalic.      Mouth/Throat:      Mouth: Mucous membranes are moist.      Comments: Red/brown circular lesion located centrally on tongue.  Eyes:      Extraocular Movements: Extraocular movements intact.      Conjunctiva/sclera: Conjunctivae normal.      Pupils: Pupils are equal, round, and reactive to light.   Cardiovascular:      Rate and Rhythm: Normal rate and regular rhythm.      Pulses: Normal pulses.      Heart sounds: Normal heart sounds.   Pulmonary:      Effort: Pulmonary effort is normal. No respiratory distress.      Breath sounds: Normal breath sounds.   Abdominal:      General: Abdomen is flat. Bowel sounds are normal.      Palpations: Abdomen is soft.      Tenderness: There is no abdominal tenderness.   Musculoskeletal:      Cervical back: Normal range of motion.      Right lower leg: No edema.      Left lower leg: No edema.   Lymphadenopathy:      Cervical: No cervical adenopathy.   Skin:     General: Skin is warm.      Capillary Refill: Capillary refill takes less than 2 seconds.   Neurological:      General: No focal deficit present.      Mental Status: He is alert and oriented to person, place, and time.      Cranial Nerves: No cranial nerve deficit.      Motor: No weakness.      Gait: Gait normal.   Psychiatric:         Mood and Affect: Mood normal.         Behavior: Behavior normal.       BP 122/80 (Site: Right Upper Arm, Position: Sitting, Cuff Size: Small Adult)   Pulse 71   Resp 16   Ht 1.829 m (6')   Wt 76.9 kg (169 lb 9.6 oz)   SpO2 95%   BMI 23.00 kg/m     Assessment:       ICD-10-CM    1. Episodic tension-type headache, not intractable  G44.219 naproxen (NAPROSYN) 250 MG tablet      2. Lesion of tongue  K14.8              Plan:   Assessment & Plan   Return if symptoms worsen or fail to  improve.    No orders of the defined types were placed in this encounter.     Pt is presenting with a tension headache. Recommended possible allergen in his home that could be the source. Naproxen 250mg  prescribed as needed for the pain and recommended pt get OTC Claritin to take daily for allergic source  of headache. Encouraged pt to also take OTC Magnesium 400mg  at night for HA prophylaxis. Clean apt regularly to avoid dust mites and mold accumulation.  Recommended oral Maalox and Benadryl suspension 5mL oral rinse as needed if lesion becomes painful. If lesion does not resolve, instructed pt to make an appointment with his Dentist for further investigation of the tongue lesion.  Pt has apt scheduled in 2 weeks and instructed to keep that appointment if his headaches do not resolve, otherwise call 1-2 days before and cancel. Pt has annual physical scheduled in August. Call office with any questions or concerns.     Patient given educational materials - see patient instructions.  Discussed use, benefit, and side effects of prescribedmedications.  All patient questions answered. Pt voiced understanding. Reviewed health maintenance.  Instructed to continue current medications, diet and exercise.  Patient agreed with treatment plan. Follow up as directed.    Electronically signed by Madelyn Brunner, PA-C on 06/30/2023 at 12:29 PM

## 2023-07-27 ENCOUNTER — Encounter: Payer: BLUE CROSS/BLUE SHIELD | Attending: Physician Assistant | Primary: Physician Assistant

## 2023-07-28 ENCOUNTER — Encounter
Admit: 2023-07-28 | Discharge: 2023-07-28 | Payer: BLUE CROSS/BLUE SHIELD | Attending: Physician Assistant | Primary: Physician Assistant

## 2023-07-28 DIAGNOSIS — R519 Headache, unspecified: Secondary | ICD-10-CM

## 2023-07-28 MED ORDER — DOXYCYCLINE HYCLATE 100 MG PO TABS
100 | ORAL_TABLET | Freq: Two times a day (BID) | ORAL | 0 refills | Status: AC
Start: 2023-07-28 — End: 2023-08-04

## 2023-07-28 MED ORDER — PREDNISONE 20 MG PO TABS
20 | ORAL_TABLET | Freq: Every day | ORAL | 0 refills | Status: AC
Start: 2023-07-28 — End: 2023-08-02

## 2023-07-28 NOTE — Progress Notes (Signed)
07/28/2023    TELEHEALTH EVALUATION -- Audio/Visual    HPI:    Brian Moses (DOB:  [birth year]) has requested an audio/video evaluation for the following concern(s):    Patient presents as a virtual visit to discuss ongoing headache.  This is primarily in the frontal/sinus region.  He feels it is related to sinus congestion.  It has been ongoing for at least a few weeks.  He has tried over-the-counter magnesium and sinus medications with temporary relief.  There is no visual changes, sensitivity to light.  Slight phonophobia.  No nausea or vomiting.  No weakness.  No other new neurological issues.  No significant family history of headache disorder.  Headache is non-intractable.  No red flag symptoms.    Patient reports tongue lesion seen last visit is totally resolved.    No other acute changes or concerns.  Blood sugars have been stable    Review of Systems   Constitutional:  Negative for chills, fatigue and fever.   HENT:  Positive for congestion, sinus pressure and sinus pain. Negative for rhinorrhea.    Eyes:  Negative for pain.   Respiratory:  Negative for cough and shortness of breath.    Cardiovascular:  Negative for chest pain and leg swelling.   Gastrointestinal:  Negative for abdominal pain, constipation, diarrhea, nausea and vomiting.   Genitourinary:  Negative for difficulty urinating, enuresis and testicular pain.   Musculoskeletal:  Negative for arthralgias, back pain and myalgias.   Skin:  Negative for rash.   Neurological:  Positive for headaches. Negative for dizziness.   Psychiatric/Behavioral:  Negative for confusion and sleep disturbance. The patient is not nervous/anxious.    All other systems reviewed and are negative.      Prior to Visit Medications    Medication Sig Taking? Authorizing Provider   doxycycline hyclate (VIBRA-TABS) 100 MG tablet Take 1 tablet by mouth 2 times daily for 7 days Yes Hargis Vandyne S, PA-C   predniSONE (DELTASONE) 20 MG tablet Take 1 tablet by mouth daily for 5 days  Yes Yahia Bottger S, PA-C   sucralfate (CARAFATE) 1 GM tablet 1 tablet on an empty stomach Orally Twice a day for 30 day(s) Yes [provider]   insulin aspart (NOVOLOG FLEXPEN) 100 UNIT/ML injection pen Inject 4 Units into the skin 3 times daily (before meals) Yes Gaye Scorza S, PA-C   Continuous Blood Gluc Sensor (FREESTYLE LIBRE 14 DAY SENSOR) MISC Use as directed for glucose monitoring Yes Kassidi Elza S, PA-C   Insulin Pen Needle 32G X 4 MM MISC 1 each by Does not apply route daily Yes Azusena Erlandson S, PA-C   BD PEN NEEDLE MICRO U/F 32G X 6 MM MISC USE AS DIRECTED 4 TIMES A DAY Yes Raymond Gurney, PA-C   BASAGLAR KWIKPEN 100 UNIT/ML injection pen INJECT 10 UNITS SUBCUTANEOUS 1X PER DAY 90 DAYS Yes Marquavius Scaife S, PA-C   Insulin Pen Needle 32G X 4 MM MISC as directed Yes [provider]   naproxen (NAPROSYN) 250 MG tablet Take 1 tablet by mouth 2 times daily as needed for Pain  Patient not taking: Reported on 07/28/2023  Ermelinda Eckert S, PA-C   famotidine (PEPCID) 20 MG tablet Take 1 tablet by mouth at bedtime  Patient not taking: Reported on 07/28/2023  Shoaib Siefker S, PA-C   tiZANidine (ZANAFLEX) 2 MG tablet Take 1 tablet by mouth 3 times daily as needed (muscle spasms)  Patient not taking: Reported on 07/28/2023  Madelyn Brunner  S, PA-C       Social History     Tobacco Use    Smoking status: Never    Smokeless tobacco: Never   Substance Use Topics    Alcohol use: Never    Drug use: Never        No Known Allergies,   Past Medical History:   Diagnosis Date    Diabetes mellitus type 1 (HCC)    , History reviewed. No pertinent surgical history.,   Social History     Tobacco Use    Smoking status: Never    Smokeless tobacco: Never   Substance Use Topics    Alcohol use: Never    Drug use: Never   ,   Family History   Problem Relation Age of Onset    Other Maternal Aunt     Colon Cancer Maternal Aunt     Colon Cancer Paternal Grandmother    ,   Immunization History   Administered Date(s)  Administered    Influenza, FLUCELVAX, (age 92 mo+), MDCK, PF, 0.40mL 12/12/2021       PHYSICAL EXAMINATION:  [ INSTRUCTIONS:  "[x] " Indicates a positive item  "[] " Indicates a negative item  -- DELETE ALL ITEMS NOT EXAMINED]  Vital Signs: (As obtained by patient/caregiver or practitioner observation)    Constitutional: [x]  Appears well-developed and well-nourished [x]  No apparent distress      []  Abnormal-   Mental status  [x]  Alert and awake  [x]  Oriented to person/place/time [x] Able to follow commands      Eyes:  EOM    [x]   Normal  []  Abnormal-  Sclera  [x]   Normal  []  Abnormal -         Discharge [x]   None visible  []  Abnormal -    HENT:   [x]  Normocephalic, atraumatic.  []  Abnormal   [x]  Mouth/Throat: Mucous membranes are moist.     External Ears [x]  Normal  []  Abnormal-     Neck: [x]  No visualized mass     Pulmonary/Chest: [x]  Respiratory effort normal.  [x]  No visualized signs of difficulty breathing or respiratory distress        []  Abnormal-      Musculoskeletal:   [x]  Normal range of motion of neck        []  Abnormal-       Neurological:        [x]  No Facial Asymmetry (Cranial nerve 7 motor function) (limited exam to video visit)          [x]  No gaze palsy        []  Abnormal-         Skin:        [x]  No significant exanthematous lesions or discoloration noted on facial skin         []  Abnormal-            Psychiatric:       [x]  Normal Affect [x]  No Hallucinations        []  Abnormal-     Other pertinent observable physical exam findings-     ASSESSMENT/PLAN:  1. Sinus headache  - doxycycline hyclate (VIBRA-TABS) 100 MG tablet; Take 1 tablet by mouth 2 times daily for 7 days  Dispense: 14 tablet; Refill: 0  - predniSONE (DELTASONE) 20 MG tablet; Take 1 tablet by mouth daily for 5 days  Dispense: 5 tablet; Refill: 0    2. Nonintractable episodic headache, unspecified headache type    Patient with continued  headache despite over-the-counter treatments.  Plan for doxycycline and prednisone to treat  sinusitis/headache.  If symptoms persist we will consider further testing/imaging and medication.  He is agreeable.  We discussed red flag symptoms and necessary contact the office or seek immediate evaluation      Return for August.    Maryellen Pile, was evaluated through a synchronous (real-time) audio-video encounter. The patient (or guardian if applicable) is aware that this is a billable service, which includes applicable co-pays. This Virtual Visit was conducted with patient's (and/or legal guardian's) consent. Patient identification was verified, and a caregiver was present when appropriate.   The patient was located at Home: [address]  [place] [address]  Provider was located at [employer] (Appt Dept): [address]  [place],  [place] [address]  Confirm you are appropriately licensed, registered, or certified to deliver care in the state where the patient is located as indicated above. If you are not or unsure, please re-schedule the visit: Yes, I confirm.       Total time spent on this encounter: Not billed by time    --Madelyn Brunner, PA-C on 07/28/2023 at 2:55 PM    An electronic signature was used to authenticate this note.

## 2023-07-29 ENCOUNTER — Encounter

## 2023-07-29 MED ORDER — NAPROXEN 250 MG PO TABS
250 MG | ORAL_TABLET | Freq: Two times a day (BID) | ORAL | 0 refills | Status: DC
Start: 2023-07-29 — End: 2023-11-08

## 2023-08-11 ENCOUNTER — Ambulatory Visit
Admit: 2023-08-11 | Discharge: 2023-08-11 | Payer: BLUE CROSS/BLUE SHIELD | Attending: Physician Assistant | Primary: Physician Assistant

## 2023-08-11 DIAGNOSIS — R519 Headache, unspecified: Secondary | ICD-10-CM

## 2023-08-11 MED ORDER — AMITRIPTYLINE HCL 25 MG PO TABS
25 | ORAL_TABLET | Freq: Every evening | ORAL | 0 refills | Status: AC
Start: 2023-08-11 — End: ?

## 2023-08-11 MED ORDER — SUMATRIPTAN SUCCINATE 50 MG PO TABS
50 | ORAL_TABLET | Freq: Once | ORAL | 0 refills | Status: AC | PRN
Start: 2023-08-11 — End: 2023-08-11

## 2023-08-11 NOTE — Progress Notes (Signed)
[facility]  [facility] [place]  [address]  [place] [address]  Dept: [phone]  Dept Fax: [phone]    Brian Moses is a 27 y.o. male who presents today for his medical conditions/complaints as noted below.    Chief Complaint   Patient presents with    Headache     Reports that the headache is still persistent. Eyes are sensitive to light, and sound.        HPI:     Patient presents the office for follow-up on headache/migraine.  He reports continued migraine headache.  He describes aura prior to onset of headache where he consents headache is starting.  This includes floaters superiorly.  He reports headaches are typically in the frontal region, but can change side-to-side.  He is becoming more sensitive to bright lights/artificial lights.  Sometimes loud noises are bothersome.  Occasional fatigue and nausea.  There is no radiating numbness or tingling.  No new neurological symptom.  No double vision or blurred vision.  No change in bowel or bladder function.  No weakness.  No facial asymmetry.  He started taking Excedrin with temporary relief.  He does have history of headaches and migraines as a teenager, but they went away for a while and now are back.    No other acute changes or concerns.  BP stable.        Hemoglobin A1C (%)   Date Value   07/10/2022 6.0   09/05/2021 8.2   05/29/2021 7.0             ( goal A1C is < 7)   No components found for: "LABMICR"  No components found for: "LDLCHOLESTEROL", "LDLCALC"    (goal LDL is <100)   AST (U/L)   Date Value   01/28/2023 29     ALT (U/L)   Date Value   01/28/2023 17     BUN (mg/dL)   Date Value   27/05/2375 15     BP Readings from Last 3 Encounters:   08/11/23 132/80   06/30/23 122/80   01/28/23 127/83          (goal 120/80)    Past Medical History:   Diagnosis Date    Diabetes mellitus type 1 (HCC)       History reviewed. No pertinent surgical history.    Family History   Problem Relation Age of Onset    Other Maternal Aunt      Colon Cancer Maternal Aunt     Colon Cancer Paternal Grandmother        Social History     Tobacco Use    Smoking status: Never    Smokeless tobacco: Never   Substance Use Topics    Alcohol use: Never      Current Outpatient Medications   Medication Sig Dispense Refill    amitriptyline (ELAVIL) 25 MG tablet Take 1 tablet by mouth nightly 30 tablet 0    SUMAtriptan (IMITREX) 50 MG tablet Take 1 tablet by mouth once as needed for Migraine 9 tablet 0    naproxen (NAPROSYN) 250 MG tablet TAKE 1 TABLET BY MOUTH TWICE A DAY AS NEEDED FOR PAIN 60 tablet 0    sucralfate (CARAFATE) 1 GM tablet 1 tablet on an empty stomach Orally Twice a day for 30 day(s)      insulin aspart (NOVOLOG FLEXPEN) 100 UNIT/ML injection pen Inject 4 Units into the skin 3 times daily (before meals) 5 Adjustable Dose Pre-filled Pen  Syringe 0    Continuous Blood Gluc Sensor (FREESTYLE LIBRE 14 DAY SENSOR) MISC Use as directed for glucose monitoring 2 each 2    Insulin Pen Needle 32G X 4 MM MISC 1 each by Does not apply route daily 100 each 5    BD PEN NEEDLE MICRO U/F 32G X 6 MM MISC USE AS DIRECTED 4 TIMES A DAY 400 each 1    BASAGLAR KWIKPEN 100 UNIT/ML injection pen INJECT 10 UNITS SUBCUTANEOUS 1X PER DAY 90 DAYS 5 Adjustable Dose Pre-filled Pen Syringe 5    Insulin Pen Needle 32G X 4 MM MISC as directed      famotidine (PEPCID) 20 MG tablet Take 1 tablet by mouth at bedtime (Patient not taking: Reported on 08/11/2023) 30 tablet 5    tiZANidine (ZANAFLEX) 2 MG tablet Take 1 tablet by mouth 3 times daily as needed (muscle spasms) (Patient not taking: Reported on 08/11/2023) 30 tablet 0     No current facility-administered medications for this visit.     No Known Allergies    Health Maintenance   Topic Date Due    Pneumococcal 0-64 years Vaccine (1 of 2 - PCV) Never done    Lipids  Never done    Varicella vaccine (1 of 2 - 13+ 2-dose series) Never done    HPV vaccine (1 - Male 3-dose series) Never done    HIV screen  Never done    Hepatitis C screen   Never done    Hepatitis B vaccine (1 of 3 - 19+ 3-dose series) Never done    DTaP/Tdap/Td vaccine (1 - Tdap) Never done    Diabetic foot exam  05/29/2022    Diabetic Alb to Cr ratio (uACR) test  05/29/2022    COVID-19 Vaccine (1 - 2023-24 season) Never done    A1C test (Diabetic or Prediabetic)  07/11/2023    Flu vaccine (1) 07/29/2023    Diabetic retinal exam  01/06/2024    Depression Screen  01/28/2024    GFR test (Diabetes, CKD 3-4, OR last GFR 15-59)  01/29/2024    Hepatitis A vaccine  Aged Out    Hib vaccine  Aged Out    Polio vaccine  Aged Out    Meningococcal (ACWY) vaccine  Aged Out       Subjective:      Review of Systems   Constitutional:  Negative for chills, fatigue and fever.   HENT:  Negative for congestion, rhinorrhea and sinus pain.    Eyes:  Positive for photophobia. Negative for pain.   Respiratory:  Negative for cough and shortness of breath.    Cardiovascular:  Negative for chest pain and leg swelling.   Gastrointestinal:  Positive for nausea. Negative for abdominal pain, constipation, diarrhea and vomiting.   Genitourinary:  Negative for difficulty urinating, enuresis and testicular pain.   Musculoskeletal:  Negative for arthralgias, back pain and myalgias.   Skin:  Negative for rash.   Neurological:  Positive for headaches. Negative for dizziness.   Psychiatric/Behavioral:  Negative for confusion and sleep disturbance. The patient is not nervous/anxious.    All other systems reviewed and are negative.      Objective:     Physical Exam  Vitals and nursing note reviewed.   Constitutional:       General: He is not in acute distress.     Appearance: Normal appearance. He is normal weight.   HENT:      Head: Normocephalic.  Mouth/Throat:      Mouth: Mucous membranes are moist.   Eyes:      Extraocular Movements: Extraocular movements intact.      Conjunctiva/sclera: Conjunctivae normal.      Pupils: Pupils are equal, round, and reactive to light.   Cardiovascular:      Rate and Rhythm: Normal  rate and regular rhythm.      Pulses: Normal pulses.      Heart sounds: Normal heart sounds.   Pulmonary:      Effort: Pulmonary effort is normal.      Breath sounds: Normal breath sounds.   Abdominal:      General: Abdomen is flat. Bowel sounds are normal.      Palpations: Abdomen is soft.      Tenderness: There is no abdominal tenderness.   Musculoskeletal:      Cervical back: Normal range of motion.      Right lower leg: No edema.      Left lower leg: No edema.   Lymphadenopathy:      Cervical: No cervical adenopathy.   Skin:     General: Skin is warm.      Capillary Refill: Capillary refill takes less than 2 seconds.   Neurological:      General: No focal deficit present.      Mental Status: He is alert and oriented to person, place, and time.      Cranial Nerves: No cranial nerve deficit.      Sensory: No sensory deficit.      Motor: No weakness.      Gait: Gait normal.   Psychiatric:         Mood and Affect: Mood normal.         Behavior: Behavior normal.       BP 132/80 (Site: Right Upper Arm, Position: Sitting, Cuff Size: Small Adult)   Pulse 88   Resp 16   Ht 1.829 m (6')   Wt 75.4 kg (166 lb 3.2 oz)   SpO2 99%   BMI 22.54 kg/m     Assessment:       ICD-10-CM    1. Recurrent headache  R51.9 amitriptyline (ELAVIL) 25 MG tablet     SUMAtriptan (IMITREX) 50 MG tablet     MRI BRAIN W CONTRAST      2. Migraine without status migrainosus, not intractable, unspecified migraine type  G43.909 amitriptyline (ELAVIL) 25 MG tablet     SUMAtriptan (IMITREX) 50 MG tablet     MRI BRAIN W CONTRAST               Plan:   Assessment & Plan   1,2.  Patient with persistent headache/migraine.  He is chronic history, over these last few months have been more difficult.  There is classic migraine features with light sensitivity, upset stomach, or.  Recommend starting amitriptyline nightly for prevention and sumatriptan for abortive therapy.  We discussed instructions and side effects.  Also order MRI brain to rule out  abnormality.  He is agreeable.  Follow-up in 1 month.    Return in about 4 weeks (around 09/08/2023) for medication recheck.    Orders Placed This Encounter   Procedures    MRI BRAIN W CONTRAST     Standing Status:   Future     Standing Expiration Date:   08/10/2024     Order Specific Question:   STAT Creatinine as needed:     Answer:   Yes  Order Specific Question:   Reason for exam:     Answer:   worsening headaches/migraines     Order Specific Question:   What is the sedation requirement?     Answer:   None         Patient given educational materials - see patient instructions.  Discussed use, benefit, and side effects of prescribedmedications.  All patient questions answered. Pt voiced understanding. Reviewed health maintenance.  Instructed to continue current medications, diet and exercise.  Patient agreed with treatment plan. Follow up as directed.        Electronically signed by Madelyn Brunner, PA-C on 08/12/2023 at 7:53 AM

## 2023-08-17 ENCOUNTER — Telehealth

## 2023-08-17 NOTE — Telephone Encounter (Signed)
PT's MRI brain needs changed to W/ and WO contrast. Pending order.

## 2023-08-17 NOTE — Telephone Encounter (Signed)
Order changed.  Thank you.

## 2023-08-20 ENCOUNTER — Encounter: Payer: BLUE CROSS/BLUE SHIELD | Attending: Physician Assistant | Primary: Physician Assistant

## 2023-08-25 ENCOUNTER — Inpatient Hospital Stay: Admit: 2023-08-25 | Payer: BLUE CROSS/BLUE SHIELD | Primary: Physician Assistant

## 2023-08-25 DIAGNOSIS — R519 Headache, unspecified: Secondary | ICD-10-CM

## 2023-08-25 MED ORDER — GADOTERIDOL 279.3 MG/ML IV SOLN
279.3 | Freq: Once | INTRAVENOUS | Status: AC | PRN
Start: 2023-08-25 — End: 2023-08-25

## 2023-08-25 MED ADMIN — gadoteridol (PROHANCE) injection 14 mL: 14 mL | INTRAVENOUS | @ 12:00:00 | NDC 00270111103

## 2023-09-02 ENCOUNTER — Encounter

## 2023-09-02 MED ORDER — AMITRIPTYLINE HCL 25 MG PO TABS
25 | ORAL_TABLET | Freq: Every evening | ORAL | 1 refills | Status: DC
Start: 2023-09-02 — End: 2024-02-22

## 2023-09-02 NOTE — Telephone Encounter (Signed)
 Brian Moses is requesting a refill on the following medication(s):  Requested Prescriptions     Pending Prescriptions Disp Refills    amitriptyline  (ELAVIL ) 25 MG tablet [Pharmacy Med Name: AMITRIPTYLINE  HCL 25 MG TAB] 90 tablet 1     Sig: TAKE 1 TABLET BY MOUTH EVERY DAY AT NIGHT       Last Visit Date (If Applicable):  08/11/2023    Next Visit Date:    09/15/2023

## 2023-09-08 ENCOUNTER — Encounter: Payer: BLUE CROSS/BLUE SHIELD | Attending: Physician Assistant | Primary: Physician Assistant

## 2023-09-15 ENCOUNTER — Ambulatory Visit
Admit: 2023-09-15 | Discharge: 2023-09-15 | Payer: BLUE CROSS/BLUE SHIELD | Attending: Physician Assistant | Primary: Physician Assistant

## 2023-09-15 VITALS — BP 112/78 | HR 96 | Ht 72.0 in | Wt 164.0 lb

## 2023-09-15 DIAGNOSIS — G43909 Migraine, unspecified, not intractable, without status migrainosus: Secondary | ICD-10-CM

## 2023-09-15 NOTE — Progress Notes (Signed)
 [facility]  [facility]  [address]  [place] [address]  Dept: [phone]  Dept Fax: [phone]    Brian Moses is a 27 y.o. male who presents today for his medical conditions/complaints as noted below.    Chief Complaint   Patient presents with    Medication Check    Migraine     Pt was started on amitriptyline  and Imitrex  at [facility] for migraines. He states that the amitriptyline  has been helpful with taking away the migraines/HA. He has not taken the Imitrex  yet. He has been told recently that his grandmother suffered from migraines and HA when he was younger.        HPI:     Patient presents to the office for medication check/migraine follow-up.  At last office he was started on amitriptyline  for recurrent headache/migraine.  Patient reports moderate improvement in symptoms.  He is having less days with a significant headache.  He has not needed to use abortive medication/Imitrex .  He feels that he is making improvement.  MRI brain completed is negative.  Of note, patient talked with his grandma and found out that she also dealt with headaches and migraines as a young adult.  Overall, he is feeling better today.  No acute changes.  Blood pressure stable.    He follows closely with endocrinology for management of type 1 diabetes.  Most recent A1c is 6.7.  They are slightly adjusting his insulin  regimen.        Hemoglobin A1C (%)   Date Value   07/10/2022 6.0   09/05/2021 8.2   05/29/2021 7.0             ( goal A1C is < 7)   No components found for: LABMICR  No components found for: LDLCHOLESTEROL, LDLCALC    (goal LDL is <100)   AST (U/L)   Date Value   01/28/2023 29     ALT (U/L)   Date Value   01/28/2023 17     BUN (mg/dL)   Date Value   97/98/7975 15     BP Readings from Last 3 Encounters:   09/15/23 112/78   08/11/23 132/80   06/30/23 122/80          (goal 120/80)    Past Medical History:   Diagnosis Date    Diabetes mellitus type 1 (HCC)       History reviewed. No  pertinent surgical history.    Family History   Problem Relation Age of Onset    Other Maternal Aunt     Colon Cancer Maternal Aunt     Colon Cancer Paternal Grandmother        Social History     Tobacco Use    Smoking status: Never    Smokeless tobacco: Never   Substance Use Topics    Alcohol use: Never      Current Outpatient Medications   Medication Sig Dispense Refill    amitriptyline  (ELAVIL ) 25 MG tablet TAKE 1 TABLET BY MOUTH EVERY DAY AT NIGHT 90 tablet 1    SUMAtriptan  (IMITREX ) 50 MG tablet Take 1 tablet by mouth once as needed for Migraine 9 tablet 0    naproxen  (NAPROSYN ) 250 MG tablet TAKE 1 TABLET BY MOUTH TWICE A DAY AS NEEDED FOR PAIN 60 tablet 0    insulin  aspart (NOVOLOG  FLEXPEN) 100 UNIT/ML injection pen Inject 4 Units into the skin 3 times daily (before meals) 5 Adjustable Dose Pre-filled Pen Syringe  0    Continuous Blood Gluc Sensor (FREESTYLE LIBRE 14 DAY SENSOR) MISC Use as directed for glucose monitoring 2 each 2    Insulin  Pen Needle 32G X 4 MM MISC 1 each by Does not apply route daily 100 each 5    BD PEN NEEDLE MICRO U/F 32G X 6 MM MISC USE AS DIRECTED 4 TIMES A DAY 400 each 1    BASAGLAR  KWIKPEN 100 UNIT/ML injection pen INJECT 10 UNITS SUBCUTANEOUS 1X PER DAY 90 DAYS 5 Adjustable Dose Pre-filled Pen Syringe 5    Insulin  Pen Needle 32G X 4 MM MISC as directed       No current facility-administered medications for this visit.     No Known Allergies    Health Maintenance   Topic Date Due    Pneumococcal 0-64 years Vaccine (1 of 2 - PCV) Never done    Lipids  Never done    Varicella vaccine (1 of 2 - 13+ 2-dose series) Never done    HPV vaccine (1 - Male 3-dose series) Never done    HIV screen  Never done    Hepatitis C screen  Never done    Hepatitis B vaccine (1 of 3 - 19+ 3-dose series) Never done    DTaP/Tdap/Td vaccine (1 - Tdap) Never done    Diabetic foot exam  05/29/2022    Diabetic Alb to Cr ratio (uACR) test  05/29/2022    A1C test (Diabetic or Prediabetic)  07/11/2023    Flu  vaccine (1) 07/29/2023    COVID-19 Vaccine (1 - 2023-24 season) Never done    Diabetic retinal exam  01/06/2024    Depression Screen  01/28/2024    GFR test (Diabetes, CKD 3-4, OR last GFR 15-59)  01/29/2024    Hepatitis A vaccine  Aged Out    Hib vaccine  Aged Out    Polio vaccine  Aged Out    Meningococcal (ACWY) vaccine  Aged Out       Subjective:      Review of Systems   Constitutional:  Negative for chills, fatigue and fever.   HENT:  Negative for congestion, rhinorrhea and sinus pain.    Eyes:  Negative for pain.   Respiratory:  Negative for cough and shortness of breath.    Cardiovascular:  Negative for chest pain and leg swelling.   Gastrointestinal:  Negative for abdominal pain, constipation, diarrhea, nausea and vomiting.   Genitourinary:  Negative for difficulty urinating, enuresis and testicular pain.   Musculoskeletal:  Negative for arthralgias, back pain and myalgias.   Skin:  Negative for rash.   Neurological:  Positive for headaches (improving). Negative for dizziness.   Psychiatric/Behavioral:  Negative for confusion and sleep disturbance. The patient is not nervous/anxious.    All other systems reviewed and are negative.      Objective:     Physical Exam  Vitals and nursing note reviewed.   Constitutional:       General: He is not in acute distress.     Appearance: Normal appearance. He is normal weight.   HENT:      Head: Normocephalic.      Right Ear: External ear normal.      Left Ear: External ear normal.      Mouth/Throat:      Mouth: Mucous membranes are moist.   Eyes:      Extraocular Movements: Extraocular movements intact.      Conjunctiva/sclera: Conjunctivae normal.  Pupils: Pupils are equal, round, and reactive to light.   Cardiovascular:      Rate and Rhythm: Normal rate and regular rhythm.      Pulses: Normal pulses.      Heart sounds: Normal heart sounds.   Pulmonary:      Effort: Pulmonary effort is normal.      Breath sounds: Normal breath sounds.   Abdominal:      General:  Abdomen is flat. Bowel sounds are normal.      Palpations: Abdomen is soft.      Tenderness: There is no abdominal tenderness.   Musculoskeletal:      Cervical back: Normal range of motion.      Right lower leg: No edema.      Left lower leg: No edema.   Lymphadenopathy:      Cervical: No cervical adenopathy.   Skin:     General: Skin is warm.      Capillary Refill: Capillary refill takes less than 2 seconds.   Neurological:      General: No focal deficit present.      Mental Status: He is alert and oriented to person, place, and time.   Psychiatric:         Mood and Affect: Mood normal.         Behavior: Behavior normal.       BP 112/78 (Site: Left Upper Arm, Position: Sitting)   Pulse 96   Ht 1.829 m (6')   Wt 74.4 kg (164 lb)   SpO2 97%   BMI 22.24 kg/m     Assessment:       ICD-10-CM    1. Migraine without status migrainosus, not intractable, unspecified migraine type  G43.909       2. Recurrent headache  R51.9                Plan:   Assessment & Plan   1,2.  Patient with recurrent headache/migraine.  There has been improvement with amitriptyline .  Plan to continue 25 mg daily.  Also encouraged nightly magnesium.  Discussed importance of hydration, migraine journal, electrolytes.  He will call the office with any changes or concerns.    Return in about 16 weeks (around 01/05/2024) for medication recheck.    No orders of the defined types were placed in this encounter.        Patient given educational materials - see patient instructions.  Discussed use, benefit, and side effects of prescribedmedications.  All patient questions answered. Pt voiced understanding. Reviewed health maintenance.  Instructed to continue current medications, diet and exercise.  Patient agreed with treatment plan. Follow up as directed.        Electronically signed by Bard Cedar, PA-C on 09/15/2023 at 10:34 AM

## 2023-09-30 ENCOUNTER — Encounter

## 2023-09-30 MED ORDER — FREESTYLE LIBRE 14 DAY SENSOR MISC
2 refills | Status: AC
Start: 2023-09-30 — End: ?

## 2023-09-30 NOTE — Telephone Encounter (Signed)
 LAST VISIT:   09/15/2023     Future Appointments   Date Time Provider Department Center   01/21/2024 10:00 AM [place] [facility] DE[facility]

## 2023-11-08 ENCOUNTER — Encounter

## 2023-11-08 MED ORDER — NAPROXEN 250 MG PO TABS
250 | ORAL_TABLET | Freq: Two times a day (BID) | ORAL | 0 refills | Status: AC
Start: 2023-11-08 — End: ?

## 2023-11-08 NOTE — Telephone Encounter (Signed)
LAST VISIT:   09/15/2023     Future Appointments   Date Time Provider Department Center   01/21/2024 10:00 AM [place] [facility] DE[facility]

## 2024-01-10 LAB — HM DIABETES EYE EXAM: Diabetic Retinopathy: NEGATIVE

## 2024-01-21 ENCOUNTER — Ambulatory Visit
Admit: 2024-01-21 | Discharge: 2024-01-21 | Payer: BLUE CROSS/BLUE SHIELD | Attending: Physician Assistant | Primary: Physician Assistant

## 2024-01-21 VITALS — BP 112/64 | HR 102 | Ht 72.0 in | Wt 175.0 lb

## 2024-01-21 DIAGNOSIS — E109 Type 1 diabetes mellitus without complications: Secondary | ICD-10-CM

## 2024-01-21 NOTE — Progress Notes (Signed)
[facility]  [facility]  [address]  [place] [address]  Dept: [phone]  Dept Fax: [phone]    Brian Moses is a 28 y.o. male who presents today for his medical conditions/complaints as noted below.    Chief Complaint   Patient presents with    Medication Check     Pt presents in office for med recheck. Pt states meds are working pt states he is feeling a lot better. No side effects.        HPI:     Patient presents the office for routine follow-up.  His past medical history including type 1 diabetes, hyperlipidemia, migraine.  Overall, patient reports migraines and headaches are well-controlled.  Sometimes will go days without any symptoms.  He is compliant with amitriptyline.  Denies any side effect.  MRI head was negative.  Patient follows closely with endocrinology for management of type 1 diabetes.  He is compliant with insulin regimen.  Denies any hypoglycemic episodes.    He does mention some intermittent issues with hand tingling.  This is sporadic and not all the time.  Primarily in the middle 3 fingers.  No tingling in the feet.  No radicular pain or numbness and tingling from the neck.  There is no recent injury or trauma.  He has tried some splinting which has helped the tingling as well.  He does admit to several hours using a keyboard or mouse and sedentary desk work.    No other concerns.        Hemoglobin A1C (%)   Date Value   07/10/2022 6.0   09/05/2021 8.2   05/29/2021 7.0             ( goal A1C is < 7)   No components found for: "LABMICR"  No components found for: "LDLCHOLESTEROL", "LDLCALC"    (goal LDL is <100)   AST (U/L)   Date Value   01/28/2023 29     ALT (U/L)   Date Value   01/28/2023 17     BUN (mg/dL)   Date Value   13/07/6577 15     BP Readings from Last 3 Encounters:   01/21/24 112/64   09/15/23 112/78   08/11/23 132/80          (goal 120/80)    Past Medical History:   Diagnosis Date    Diabetes mellitus type 1 (HCC)       History  reviewed. No pertinent surgical history.    Family History   Problem Relation Age of Onset    Other Maternal Aunt     Colon Cancer Maternal Aunt     Colon Cancer Paternal Grandmother        Social History     Tobacco Use    Smoking status: Never    Smokeless tobacco: Never   Substance Use Topics    Alcohol use: Never      Current Outpatient Medications   Medication Sig Dispense Refill    naproxen (NAPROSYN) 250 MG tablet TAKE 1 TABLET BY MOUTH TWICE A DAY AS NEEDED FOR PAIN 60 tablet 0    Continuous Glucose Sensor (FREESTYLE LIBRE 14 DAY SENSOR) MISC USE AS DIRECTED FOR GLUCOSE MONITORING 6 each 2    amitriptyline (ELAVIL) 25 MG tablet TAKE 1 TABLET BY MOUTH EVERY DAY AT NIGHT 90 tablet 1    insulin aspart (NOVOLOG FLEXPEN) 100 UNIT/ML injection pen Inject 4 Units into the skin 3 times daily (before  meals) 5 Adjustable Dose Pre-filled Pen Syringe 0    Insulin Pen Needle 32G X 4 MM MISC 1 each by Does not apply route daily 100 each 5    BD PEN NEEDLE MICRO U/F 32G X 6 MM MISC USE AS DIRECTED 4 TIMES A DAY 400 each 1    BASAGLAR KWIKPEN 100 UNIT/ML injection pen INJECT 10 UNITS SUBCUTANEOUS 1X PER DAY 90 DAYS 5 Adjustable Dose Pre-filled Pen Syringe 5    Insulin Pen Needle 32G X 4 MM MISC as directed      SUMAtriptan (IMITREX) 50 MG tablet Take 1 tablet by mouth once as needed for Migraine 9 tablet 0     No current facility-administered medications for this visit.     No Known Allergies    Health Maintenance   Topic Date Due    Pneumococcal 0-64 years Vaccine (1 of 2 - PCV) Never done    Lipids  Never done    Varicella vaccine (1 of 2 - 13+ 2-dose series) Never done    HPV vaccine (1 - Male 3-dose series) Never done    HIV screen  Never done    Hepatitis C screen  Never done    Hepatitis B vaccine (1 of 3 - 19+ 3-dose series) Never done    DTaP/Tdap/Td vaccine (1 - Tdap) Never done    Diabetic foot exam  05/29/2022    Diabetic Alb to Cr ratio (uACR) test  05/29/2022    A1C test (Diabetic or Prediabetic)  07/11/2023     Flu vaccine (1) 07/29/2023    COVID-19 Vaccine (1 - 2023-24 season) Never done    GFR test (Diabetes, CKD 3-4, OR last GFR 15-59)  01/29/2024    Depression Screen  01/20/2025    Diabetic retinal exam  01/09/2026    Hepatitis A vaccine  Aged Out    Hib vaccine  Aged Out    Polio vaccine  Aged Out    Meningococcal (ACWY) vaccine  Aged Out       Subjective:      Review of Systems   Constitutional:  Negative for chills, fatigue and fever.   HENT:  Negative for congestion, rhinorrhea and sinus pain.    Eyes:  Negative for pain.   Respiratory:  Negative for cough and shortness of breath.    Cardiovascular:  Negative for chest pain and leg swelling.   Gastrointestinal:  Negative for abdominal pain, constipation, diarrhea, nausea and vomiting.   Genitourinary:  Negative for difficulty urinating, enuresis and testicular pain.   Musculoskeletal:  Negative for arthralgias, back pain and myalgias.   Skin:  Negative for rash.   Neurological:  Positive for headaches (improved). Negative for dizziness.        + hand tingling   Psychiatric/Behavioral:  Negative for confusion and sleep disturbance. The patient is not nervous/anxious.    All other systems reviewed and are negative.      Objective:     Physical Exam  Vitals and nursing note reviewed.   Constitutional:       General: He is not in acute distress.     Appearance: Normal appearance. He is normal weight.   HENT:      Head: Normocephalic.      Mouth/Throat:      Mouth: Mucous membranes are moist.   Eyes:      Extraocular Movements: Extraocular movements intact.      Conjunctiva/sclera: Conjunctivae normal.      Pupils: Pupils are equal,  round, and reactive to light.   Cardiovascular:      Rate and Rhythm: Normal rate and regular rhythm.      Pulses: Normal pulses.      Heart sounds: Normal heart sounds.   Pulmonary:      Effort: Pulmonary effort is normal. No respiratory distress.      Breath sounds: Normal breath sounds.   Abdominal:      General: Abdomen is flat. Bowel  sounds are normal.      Palpations: Abdomen is soft.      Tenderness: There is no abdominal tenderness.   Musculoskeletal:      Cervical back: Normal range of motion.      Right lower leg: No edema.      Left lower leg: No edema.      Comments: Bilateral hands: No erythema, edema, ecchymosis.  Pulses 2+.  Cap refill less than 2 seconds.  There is negative Tinel's sign over the carpal tunnel.  Good range of motion of the wrist and digits.  Sensation is intact.   Lymphadenopathy:      Cervical: No cervical adenopathy.   Skin:     General: Skin is warm.      Capillary Refill: Capillary refill takes less than 2 seconds.   Neurological:      General: No focal deficit present.      Mental Status: He is alert and oriented to person, place, and time.   Psychiatric:         Mood and Affect: Mood normal.         Behavior: Behavior normal.       BP 112/64 (Site: Left Upper Arm, Position: Sitting, Cuff Size: Small Adult)   Pulse (!) 102   Ht 1.829 m (6')   Wt 79.4 kg (175 lb)   SpO2 96%   BMI 23.73 kg/m     Assessment:       ICD-10-CM    1. Type 1 diabetes mellitus without complication (HCC)  E10.9       2. Migraine without status migrainosus, not intractable, unspecified migraine type  G43.909       3. Hand tingling  R20.2                Plan:   Assessment & Plan   1.  Patient with history of type 1 diabetes.  He is following closely with endocrinology.  He is up-to-date on screenings.  Most recent A1c is 6.3.  Continue current insulin regimen.  2.  Migraine has significantly improved.  Imaging was negative.  Will continue amitriptyline 25 mg nightly.  3.  We discussed intermittent tingling of bilateral hands.  Discussed possible carpal tunnel versus early neuropathy.  Recommend a B12 supplement and nighttime splinting.  Discussed appropriate stretching for carpal tunnel.    Return in about 6 months (around 07/20/2024) for physical.    No orders of the defined types were placed in this encounter.        Patient given  educational materials - see patient instructions.  Discussed use, benefit, and side effects of prescribedmedications.  All patient questions answered. Pt voiced understanding. Reviewed health maintenance.  Instructed to continue current medications, diet and exercise.  Patient agreed with treatment plan. Follow up as directed.        Electronically signed by Madelyn Brunner, PA-C on 01/21/2024 at 12:23 PM

## 2024-01-24 ENCOUNTER — Encounter

## 2024-01-25 MED ORDER — BASAGLAR KWIKPEN 100 UNIT/ML SC SOPN
100 UNIT/ML | SUBCUTANEOUS | 5 refills | Status: DC
Start: 2024-01-25 — End: 2024-02-10

## 2024-01-25 NOTE — Telephone Encounter (Signed)
Brian Moses is requesting a refill on the following medication(s):  Requested Prescriptions     Pending Prescriptions Disp Refills    BASAGLAR KWIKPEN 100 UNIT/ML injection pen [Pharmacy Med Name: BASAGLAR 100 UNIT/ML KWIKPEN]  5     Sig: INJECT 10 UNITS SUBCUTANEOUS 1X PER DAY 90 DAYS       Last Visit Date (If Applicable):  01/21/2024    Next Visit Date:    07/21/2024

## 2024-01-27 ENCOUNTER — Encounter

## 2024-01-31 ENCOUNTER — Encounter

## 2024-02-10 ENCOUNTER — Encounter

## 2024-02-10 MED ORDER — BASAGLAR KWIKPEN 100 UNIT/ML SC SOPN
100 | SUBCUTANEOUS | 5 refills | Status: AC
Start: 2024-02-10 — End: ?

## 2024-02-10 NOTE — Telephone Encounter (Signed)
 Last app 1/24  Next app 7/25

## 2024-02-20 ENCOUNTER — Encounter

## 2024-02-21 NOTE — Telephone Encounter (Signed)
 Brian Moses is requesting a refill on the following medication(s):  Requested Prescriptions     Pending Prescriptions Disp Refills    amitriptyline (ELAVIL) 25 MG tablet [Pharmacy Med Name: AMITRIPTYLINE HCL 25 MG TAB] 90 tablet 1     Sig: TAKE 1 TABLET BY MOUTH EVERY DAY AT NIGHT       Last Visit Date (If Applicable):  01/21/2024    Next Visit Date:    07/21/2024

## 2024-02-22 MED ORDER — AMITRIPTYLINE HCL 25 MG PO TABS
25 | ORAL_TABLET | Freq: Every evening | ORAL | 1 refills | Status: AC
Start: 2024-02-22 — End: ?

## 2024-03-10 LAB — POCT GLYCOSYLATED HEMOGLOBIN (HGB A1C): Hemoglobin A1C: 6.8 %

## 2024-07-21 ENCOUNTER — Encounter: Payer: BLUE CROSS/BLUE SHIELD | Attending: Physician Assistant | Primary: Physician Assistant

## 2024-07-24 MED ORDER — FAMOTIDINE 20 MG PO TABS
20 | ORAL_TABLET | Freq: Every evening | ORAL | 1 refills | 30.00000 days | Status: AC
Start: 2024-07-24 — End: ?

## 2024-07-24 NOTE — Telephone Encounter (Signed)
 Last Visit Date: 01/21/2024   Next Visit Date: Visit date not found

## 2024-08-24 ENCOUNTER — Encounter

## 2024-08-24 NOTE — Telephone Encounter (Signed)
 Sent Patient a [employer].
